# Patient Record
Sex: Female | Born: 2000 | Race: White | Hispanic: No | Marital: Single | Smoking: Never smoker
Health system: Southern US, Community
[De-identification: ages and names within clinical notes are randomized; demographics above are authoritative.]

## PROBLEM LIST (undated history)

## (undated) DIAGNOSIS — F32A Depression, unspecified: Secondary | ICD-10-CM

## (undated) DIAGNOSIS — F419 Anxiety disorder, unspecified: Secondary | ICD-10-CM

## (undated) DIAGNOSIS — T7840XA Allergy, unspecified, initial encounter: Secondary | ICD-10-CM

## (undated) HISTORY — PX: NO PAST SURGERIES: SHX2092

## (undated) HISTORY — DX: Allergy, unspecified, initial encounter: T78.40XA

## (undated) HISTORY — DX: Depression, unspecified: F32.A

## (undated) HISTORY — DX: Anxiety disorder, unspecified: F41.9

---
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
## 2019-02-08 ENCOUNTER — Other Ambulatory Visit: Payer: Self-pay

## 2019-02-08 ENCOUNTER — Ambulatory Visit (INDEPENDENT_AMBULATORY_CARE_PROVIDER_SITE_OTHER): Payer: 59 | Admitting: Certified Nurse Midwife

## 2019-02-08 VITALS — BP 119/74 | HR 86 | Ht 65.0 in | Wt 174.0 lb

## 2019-02-08 DIAGNOSIS — Z79899 Other long term (current) drug therapy: Secondary | ICD-10-CM

## 2019-02-08 DIAGNOSIS — Z3042 Encounter for surveillance of injectable contraceptive: Secondary | ICD-10-CM

## 2019-02-08 MED ORDER — MEDROXYPROGESTERONE ACETATE 150 MG/ML IM SUSP
150.0000 mg | Freq: Once | INTRAMUSCULAR | Status: AC
  Administered 2019-02-08: 150 mg via INTRAMUSCULAR

## 2019-02-08 NOTE — Progress Notes (Signed)
Date last pap: N/A. Last Depo-Provera: 11/19/2018. Side Effects if any: None per pt. Serum HCG indicated? N/A, patient is within dates. Depo-Provera 150 mg IM given by: Jennye Moccasin, CMA. Next appointment due 2/15-05/10/2019.  BP 119/74   Pulse 86   Ht 5\' 5"  (1.651 m)   Wt 174 lb (78.9 kg)   LMP 01/25/2019 (Approximate)   BMI 28.96 kg/m

## 2019-04-26 ENCOUNTER — Ambulatory Visit: Payer: 59

## 2019-04-27 ENCOUNTER — Telehealth: Payer: Self-pay

## 2019-04-27 ENCOUNTER — Other Ambulatory Visit: Payer: Self-pay

## 2019-04-27 ENCOUNTER — Telehealth: Payer: Self-pay | Admitting: Certified Nurse Midwife

## 2019-04-27 ENCOUNTER — Ambulatory Visit: Payer: BLUE CROSS/BLUE SHIELD

## 2019-04-27 MED ORDER — MEDROXYPROGESTERONE ACETATE 150 MG/ML IM SUSP: 150.0000 mg | INTRAMUSCULAR | 0 refills | Status: DC

## 2019-04-27 NOTE — Telephone Encounter (Signed)
Refill of Depo sent to preferred pharmacy per patient request.

## 2019-04-27 NOTE — Telephone Encounter (Signed)
Patient called following up with her depo refill. Could you please advise patient.  -TC

## 2019-04-27 NOTE — Telephone Encounter (Signed)
Patient is at pharmacy [facility]- they told her she doesn't have any refills for her depo shot. Could you please advise patient.  -TC

## 2019-04-27 NOTE — Telephone Encounter (Signed)
Refill sent 04/26/19-Tabitha Gus Height attempted to contact patient- message left.

## 2019-04-27 NOTE — Telephone Encounter (Signed)
Called patient to inform her that her prescription was refilled. She was unable to come to the phone.   -TC

## 2019-05-12 ENCOUNTER — Other Ambulatory Visit: Payer: Self-pay

## 2019-05-12 ENCOUNTER — Ambulatory Visit (INDEPENDENT_AMBULATORY_CARE_PROVIDER_SITE_OTHER): Payer: BC Managed Care – PPO | Admitting: Certified Nurse Midwife

## 2019-05-12 VITALS — BP 113/75 | HR 64 | Ht 65.0 in | Wt 173.5 lb

## 2019-05-12 DIAGNOSIS — Z3042 Encounter for surveillance of injectable contraceptive: Secondary | ICD-10-CM

## 2019-05-12 DIAGNOSIS — Z3202 Encounter for pregnancy test, result negative: Secondary | ICD-10-CM

## 2019-05-12 DIAGNOSIS — R300 Dysuria: Secondary | ICD-10-CM

## 2019-05-12 LAB — POCT URINALYSIS DIPSTICK
Bilirubin, UA: NEGATIVE
Blood, UA: NEGATIVE
Glucose, UA: NEGATIVE
Ketones, UA: NEGATIVE
Nitrite, UA: NEGATIVE
Protein, UA: NEGATIVE
Spec Grav, UA: 1.02 (ref 1.010–1.025)
Urobilinogen, UA: 0.2 E.U./dL
pH, UA: 6 (ref 5.0–8.0)

## 2019-05-12 LAB — POCT URINE PREGNANCY: Preg Test, Ur: NEGATIVE

## 2019-05-12 MED ORDER — MEDROXYPROGESTERONE ACETATE 150 MG/ML IM SUSP
150.0000 mg | Freq: Once | INTRAMUSCULAR | Status: AC
  Administered 2019-05-12: 11:00:00 150 mg via INTRAMUSCULAR

## 2019-05-12 NOTE — Progress Notes (Signed)
Date last pap: N/A. Last Depo-Provera: 11/19/2018. Side Effects if any: None per patient. Urine HCG indicated? Negative. Depo-Provera 150 mg IM given by: Park Meo, CMA. Next appointment due 5/19-08/11/2019.  BP 113/75   Pulse 64   Ht 5\' 5"  (1.651 m)   Wt 173 lb 8 oz (78.7 kg)   LMP  (LMP Unknown)   BMI 28.87 kg/m    Patient c/o occasional dysuria and vaginal itching x3 weeks, takes AZO with some relief, no vaginal discharge.  Vaginal self swab option given and patient declined.

## 2019-05-14 LAB — URINE CULTURE

## 2019-07-29 ENCOUNTER — Other Ambulatory Visit: Payer: Self-pay

## 2019-07-29 ENCOUNTER — Other Ambulatory Visit: Payer: Self-pay | Admitting: Certified Nurse Midwife

## 2019-07-29 ENCOUNTER — Ambulatory Visit (INDEPENDENT_AMBULATORY_CARE_PROVIDER_SITE_OTHER): Payer: BC Managed Care – PPO | Admitting: Certified Nurse Midwife

## 2019-07-29 DIAGNOSIS — Z3042 Encounter for surveillance of injectable contraceptive: Secondary | ICD-10-CM

## 2019-07-29 MED ORDER — MEDROXYPROGESTERONE ACETATE 150 MG/ML IM SUSP
150.0000 mg | Freq: Once | INTRAMUSCULAR | Status: AC
  Administered 2019-07-29: 150 mg via INTRAMUSCULAR

## 2019-07-29 NOTE — Progress Notes (Signed)
Date last pap: n/a. Last Depo-Provera: 05/12/2019 Side Effects if any: increase appetite Serum HCG indicated? n/a Depo-Provera 150 mg IM given by: Shawn Route, LPN. Next appointment due Aug 5-19,2021

## 2019-07-29 NOTE — Telephone Encounter (Signed)
Refill sent to preferred pharmacy. 

## 2019-10-11 ENCOUNTER — Emergency Department: Payer: BLUE CROSS/BLUE SHIELD

## 2019-10-11 ENCOUNTER — Emergency Department
Admission: EM | Admit: 2019-10-11 | Discharge: 2019-10-12 | Disposition: A | Discharge status: Home / Self Care | Payer: BLUE CROSS/BLUE SHIELD | Attending: Emergency Medicine | Admitting: Emergency Medicine

## 2019-10-11 ENCOUNTER — Other Ambulatory Visit: Payer: Self-pay

## 2019-10-11 DIAGNOSIS — R1030 Lower abdominal pain, unspecified: Secondary | ICD-10-CM | POA: Insufficient documentation

## 2019-10-11 DIAGNOSIS — R102 Pelvic and perineal pain: Secondary | ICD-10-CM | POA: Diagnosis not present

## 2019-10-11 LAB — URINALYSIS, COMPLETE (UACMP) WITH MICROSCOPIC
Bilirubin Urine: NEGATIVE
Glucose, UA: NEGATIVE mg/dL
Hgb urine dipstick: NEGATIVE
Ketones, ur: NEGATIVE mg/dL
Leukocytes,Ua: NEGATIVE
Nitrite: NEGATIVE
Protein, ur: NEGATIVE mg/dL
Specific Gravity, Urine: 1.009 (ref 1.005–1.030)
pH: 5 (ref 5.0–8.0)

## 2019-10-11 LAB — WET PREP, GENITAL
Clue Cells Wet Prep HPF POC: NONE SEEN
Sperm: NONE SEEN
Trich, Wet Prep: NONE SEEN
Yeast Wet Prep HPF POC: NONE SEEN

## 2019-10-11 LAB — COMPREHENSIVE METABOLIC PANEL
ALT: 14 U/L (ref 0–44)
AST: 17 U/L (ref 15–41)
Albumin: 4.2 g/dL (ref 3.5–5.0)
Alkaline Phosphatase: 116 U/L (ref 38–126)
Anion gap: 9 (ref 5–15)
BUN: 9 mg/dL (ref 6–20)
CO2: 23 mmol/L (ref 22–32)
Calcium: 9.3 mg/dL (ref 8.9–10.3)
Chloride: 108 mmol/L (ref 98–111)
Creatinine, Ser: 0.6 mg/dL (ref 0.44–1.00)
GFR calc Af Amer: >60 mL/min (ref >60)
GFR calc non Af Amer: >60 mL/min (ref >60)
Glucose, Bld: 95 mg/dL (ref 70–99)
Potassium: 3.7 mmol/L (ref 3.5–5.1)
Sodium: 140 mmol/L (ref 135–145)
Total Bilirubin: 0.4 mg/dL (ref 0.3–1.2)
Total Protein: 7.9 g/dL (ref 6.5–8.1)

## 2019-10-11 LAB — CBC
HCT: 37.4 % (ref 36.0–46.0)
Hemoglobin: 12.6 g/dL (ref 12.0–15.0)
MCH: 28.9 pg (ref 26.0–34.0)
MCHC: 33.7 g/dL (ref 30.0–36.0)
MCV: 85.8 fL (ref 80.0–100.0)
Platelets: 404 10*3/uL — ABNORMAL HIGH (ref 150–400)
RBC: 4.36 MIL/uL (ref 3.87–5.11)
RDW: 13.6 % (ref 11.5–15.5)
WBC: 7.4 10*3/uL (ref 4.0–10.5)
nRBC: 0 % (ref 0.0–0.2)

## 2019-10-11 LAB — LIPASE, BLOOD: Lipase: 25 U/L (ref 11–51)

## 2019-10-11 LAB — POCT PREGNANCY, URINE: Preg Test, Ur: NEGATIVE

## 2019-10-11 NOTE — ED Provider Notes (Signed)
-----------------------------------------   11:10 PM on 10/11/2019 -----------------------------------------  Assuming care from Dr. Lenard Lance.  In short, Rachel Garner is a 19 y.o. female with a chief complaint of pelvic pain.  Refer to the original H&P for additional details.  The current plan of care is to obtain U/S and reassess.   ----------------------------------------- 12:11 AM on 10/12/2019 -----------------------------------------  Patient's work-up has been very reassuring.  Vital signs are stable, no tachycardia, no evidence of acute infection including no STDs or abnormalities on wet prep.  Ultrasound also shows no sign of acute intrapelvic pathology.  Patient is resting quietly and apparently comfortably.  I updated her and her mother regarding all of the results and they are comfortable with the plan for discharge and outpatient follow-up.  Of note, she is nearing the time for another Depo shot and so they speculated that perhaps the pain is related to hormones and I think that is very possible.  I gave my usual and customary follow-up recommendations and return precautions.   Loleta Rose, MD 10/12/19 [phone]

## 2019-10-11 NOTE — ED Provider Notes (Signed)
[facility] Emergency Department Provider Note  Time seen: 10:52 PM  I have reviewed the triage vital signs and the nursing notes.   HISTORY  Chief Complaint Pelvic Pain and Vaginal Pain   HPI Rachel Garner is a 19 y.o. female no significant past medical history presents emergency department for lower abdominal discomfort.  According to the patient over the past month or so she has been experiencing intermittent lower abdominal discomfort and vaginal pain.   Past Medical History:  Diagnosis Date  . Allergy    seasonal (ragweed, "pigweed")    Patient Active Problem List   Diagnosis Date Noted  . Anxiety and depression 12/05/2016  . 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 NO PAST SURGERIES      Prior to Admission medications   Medication Sig Start Date End Date Taking? Authorizing Provider  albuterol (VENTOLIN HFA) 108 (90 Base) MCG/ACT inhaler Inhale 2 puffs into the lungs every 4 (four) hours as needed for wheezing or shortness of breath. 07/05/18   Alfred Levins, [place], MD  medroxyPROGESTERone Acetate 150 MG/ML SUSY INJECT 1 ML (150 MG TOTAL) INTO THE MUSCLE EVERY 3 MONTHS 07/29/19   Lawhorn, Lara Mulch, CNM    No Known Allergies  Family History  Problem Relation Age of Onset  . Alcohol abuse Mother   . Depression Mother   . Breast cancer Mother 70       HER1  . Alcohol abuse Father   . Heart disease Maternal Grandmother   . Skin cancer Maternal Grandmother   . Thyroid disease Maternal Grandmother   . Colon cancer Maternal Grandmother   . Multiple sclerosis Paternal Grandmother   . Heart disease Paternal Grandfather   . Stroke Neg Hx   . Ovarian cancer Neg Hx     Social History Social History   Tobacco Use  . Smoking status: Never Smoker  . Smokeless tobacco: Never Used  Vaping Use  . Vaping Use: Never used  Substance Use Topics  . Alcohol use: No  . Drug use:  Yes    Frequency: 4.0 times per week    Types: Marijuana    Review of Systems Constitutional: Negative for fever. Cardiovascular: Negative for chest pain. Respiratory: Negative for shortness of breath. Gastrointestinal: Negative for abdominal pain, vomiting Musculoskeletal: Negative for musculoskeletal complaints Neurological: Negative for headache All other ROS negative  ____________________________________________   PHYSICAL EXAM:  VITAL SIGNS: ED Triage Vitals  Enc Vitals Group     BP 10/11/19 1944 (!) 131/92     Pulse Rate 10/11/19 1944 88     Resp 10/11/19 1944 20     Temp 10/11/19 1944 99.5 F (37.5 C)     Temp Source 10/11/19 1944 Oral     SpO2 10/11/19 1944 100 %     Weight 10/11/19 1945 180 lb (81.6 kg)     Height 10/11/19 1945 '5\' 5"'  (1.651 m)     Head Circumference --      Peak Flow --      Pain Score 10/11/19 1944 7     Pain Loc --      Pain Edu? --      Excl. in [place]? --     Constitutional: Alert and oriented. Well appearing and in no distress. Eyes: Normal exam ENT      Head: Normocephalic and atraumatic.      Mouth/Throat: Mucous membranes are moist. Cardiovascular: Normal rate, regular rhythm.  No murmurs, rubs, or gallops. Respiratory: Normal respiratory effort without tachypnea nor retractions. Breath sounds are clear  Gastrointestinal: Slight suprapubic tenderness palpation.  No rebound guarding or distention. Musculoskeletal: Nontender with normal range of motion in all extremities. Neurologic:  Normal speech and language. No gross focal neurologic deficits Skin:  Skin is warm, dry and intact.  Psychiatric: Mood and affect are normal.  ____________________________________________     RADIOLOGY  Ultrasound pending, patient care signed out to oncoming physician.  ____________________________________________   INITIAL IMPRESSION / ASSESSMENT AND PLAN / ED COURSE  Pertinent labs & imaging results that were available during my care of the  patient were reviewed by me and considered in my medical decision making (see chart for details).   Patient presents emergency department for lower abdominal discomfort and cramping.  Pelvic exam shows no significant findings, wet prep negative.  GC/chlamydia negative.  Given complaint of cramping we will obtain a pelvic ultrasound to further evaluate.  Pelvic ultrasound pending.  Patient care signed out to oncoming physician.  Labs are largely within normal limits.  Rachel Garner was evaluated in Emergency Department on 10/11/2019 for the symptoms described in the history of present illness. She was evaluated in the context of the global COVID-19 pandemic, which necessitated consideration that the patient might be at risk for infection with the SARS-CoV-2 virus that causes COVID-19. Institutional protocols and algorithms that pertain to the evaluation of patients at risk for COVID-19 are in a state of rapid change based on information released by regulatory bodies including the CDC and federal and state organizations. These policies and algorithms were followed during the patient's care in the ED.  ____________________________________________   FINAL CLINICAL IMPRESSION(S) / ED DIAGNOSES  Abdominal cramping   Harvest Dark, MD 10/12/19 2359

## 2019-10-11 NOTE — ED Notes (Signed)
Pt at US at this time

## 2019-10-11 NOTE — ED Notes (Signed)
See triage note, pt reports groin pain that radiates to vaginal area and buttocks for the past week intermittently. Reports some vomiting within the past few days.  Denies pain with urination, fevers.  Reports some light spotting/vaginal bleeding, almost time for next depo shot, spotting is normal for her at this time

## 2019-10-11 NOTE — ED Triage Notes (Signed)
Pt states she is experiencing lower abdominal/pelvic pain and vaginal pain that radiates to the buttock. Pt states this pain has been progressively worsening for the last week. Denies burning with urination.

## 2019-10-11 NOTE — ED Notes (Signed)
Pt and pt mother state they are ready to go home when they can, denies any needs at this time

## 2019-10-12 LAB — CHLAMYDIA/NGC RT PCR (ARMC ONLY)
Chlamydia Tr: NOT DETECTED
N gonorrhoeae: NOT DETECTED

## 2019-10-12 NOTE — Discharge Instructions (Signed)
Your workup in the Emergency Department today was reassuring.  We did not find any specific abnormalities.  We recommend you drink plenty of fluids, take your regular medications and/or any new ones prescribed today, and follow up with the doctor(s) listed in these documents as recommended.  Return to the Emergency Department if you develop new or worsening symptoms that concern you.  

## 2019-10-29 ENCOUNTER — Ambulatory Visit (INDEPENDENT_AMBULATORY_CARE_PROVIDER_SITE_OTHER): Payer: BC Managed Care – PPO

## 2019-10-29 ENCOUNTER — Other Ambulatory Visit: Payer: Self-pay

## 2019-10-29 DIAGNOSIS — Z3042 Encounter for surveillance of injectable contraceptive: Secondary | ICD-10-CM | POA: Diagnosis not present

## 2019-10-29 MED ORDER — MEDROXYPROGESTERONE ACETATE 150 MG/ML IM SUSP
150.0000 mg | Freq: Once | INTRAMUSCULAR | Status: AC
  Administered 2019-10-29: 150 mg via INTRAMUSCULAR

## 2019-10-29 NOTE — Progress Notes (Signed)
Date last pap: underage Last Depo-Provera: 07/29/2019 . Side Effects if any: none Serum HCG indicated? N/a. Depo-Provera 150 mg IM given by: FHampton, LPN. Next appointment due November 5-19, 2021.

## 2019-11-03 ENCOUNTER — Ambulatory Visit (INDEPENDENT_AMBULATORY_CARE_PROVIDER_SITE_OTHER): Payer: BC Managed Care – PPO | Admitting: Certified Nurse Midwife

## 2019-11-03 ENCOUNTER — Encounter: Payer: Self-pay | Admitting: Certified Nurse Midwife

## 2019-11-03 ENCOUNTER — Other Ambulatory Visit (HOSPITAL_COMMUNITY)
Admission: RE | Admit: 2019-11-03 | Discharge: 2019-11-03 | Disposition: A | Discharge status: Home / Self Care | Payer: BC Managed Care – PPO | Source: Ambulatory Visit | Attending: Certified Nurse Midwife | Admitting: Certified Nurse Midwife

## 2019-11-03 ENCOUNTER — Other Ambulatory Visit: Payer: Self-pay

## 2019-11-03 VITALS — BP 106/77 | HR 65 | Ht 65.0 in | Wt 186.2 lb

## 2019-11-03 DIAGNOSIS — R102 Pelvic and perineal pain: Secondary | ICD-10-CM | POA: Diagnosis present

## 2019-11-03 NOTE — Progress Notes (Signed)
GYN ENCOUNTER NOTE  Subjective:       Rachel Garner is a 19 y.o. G0P0000 female is here for gynecologic evaluation of the following issues:  1. Pelvic pain , she went to the emergency department earlier this month and was referred here. She states she has had the pain x 2 months. She states it is constant and has progressed in intensity. It describes it as a stabbing pain causing her to double over at it worst. She denies any fever, and change in her cycle . She denies any changes in her bowel movements ( once daily) , she denies urgency or burning of urination. States she has frequency but that is not new for her. She denies any change in the pain with intercourse. She admits she is not able to have intercourse as frequently due to the pain. She denies any history of PCOS, ovarian cysts or endometriosis. She has a cousin that has enodometriosis. All of her testing done at the hospital was negative. She is on depo injections for birth control and states it has worked well for her.    Gynecologic History No LMP recorded. Patient has had an injection. Contraception: Depo-Provera injections Last Pap: n/a  Last mammogram: n/a  Obstetric History OB History  Gravida Para Term Preterm AB Living  0 0 0 0 0 0  SAB TAB Ectopic Multiple Live Births  0 0 0 0 0    Past Medical History:  Diagnosis Date  . Allergy    seasonal (ragweed, "pigweed")    Past Surgical History:  Procedure Laterality Date  . NO PAST SURGERIES      Current Outpatient Medications on File Prior to Visit  Medication Sig Dispense Refill  . albuterol (VENTOLIN HFA) 108 (90 Base) MCG/ACT inhaler Inhale 2 puffs into the lungs every 4 (four) hours as needed for wheezing or shortness of breath. 1 Inhaler 0  . medroxyPROGESTERone Acetate 150 MG/ML SUSY INJECT 1 ML (150 MG TOTAL) INTO THE MUSCLE EVERY 3 MONTHS 1 mL 3   No current facility-administered medications on file prior to visit.    No Known Allergies  Social  History   Socioeconomic History  . Marital status: Single    Spouse name: Not on file  . Number of children: Not on file  . Years of education: Not on file  . Highest education level: Not on file  Occupational History  . Not on file  Tobacco Use  . Smoking status: Never Smoker  . Smokeless tobacco: Never Used  Vaping Use  . Vaping Use: Never used  Substance and Sexual Activity  . Alcohol use: No  . Drug use: Yes    Frequency: 4.0 times per week    Types: Marijuana  . Sexual activity: Yes    Birth control/protection: Injection  Other Topics Concern  . Not on file  Social History Narrative  . Not on file   Social Determinants of Health   Financial Resource Strain:   . Difficulty of Paying Living Expenses: Not on file  Food Insecurity:   . Worried About Charity fundraiser in the Last Year: Not on file  . Ran Out of Food in the Last Year: Not on file  Transportation Needs:   . Lack of Transportation (Medical): Not on file  . Lack of Transportation (Non-Medical): Not on file  Physical Activity:   . Days of Exercise per Week: Not on file  . Minutes of Exercise per Session: Not on file  Stress:   . Feeling of Stress : Not on file  Social Connections:   . Frequency of Communication with Friends and Family: Not on file  . Frequency of Social Gatherings with Friends and Family: Not on file  . Attends Religious Services: Not on file  . Active Member of Clubs or Organizations: Not on file  . Attends Archivist Meetings: Not on file  . Marital Status: Not on file  Intimate Partner Violence:   . Fear of Current or Ex-Partner: Not on file  . Emotionally Abused: Not on file  . Physically Abused: Not on file  . Sexually Abused: Not on file    Family History  Problem Relation Age of Onset  . Alcohol abuse Mother   . Depression Mother   . Breast cancer Mother 24       HER1  . Alcohol abuse Father   . Heart disease Maternal Grandmother   . Skin cancer Maternal  Grandmother   . Thyroid disease Maternal Grandmother   . Colon cancer Maternal Grandmother   . Multiple sclerosis Paternal Grandmother   . Heart disease Paternal Grandfather   . Stroke Neg Hx   . Ovarian cancer Neg Hx     The following portions of the patient's history were reviewed and updated as appropriate: allergies, current medications, past family history, past medical history, past social history, past surgical history and problem list.  Review of Systems Review of Systems - Negative except as mentioned in HPI Review of Systems - General ROS: negative for - chills, fatigue, fever, hot flashes, malaise or night sweats Hematological and Lymphatic ROS: negative for - bleeding problems or swollen lymph nodes Gastrointestinal ROS: negative for - abdominal pain, blood in stools, change in bowel habits and nausea/vomiting Musculoskeletal ROS: negative for - joint pain, muscle pain or muscular weakness Genito-Urinary ROS: negative for - change in menstrual cycle, dysmenorrhea, dyspareunia, dysuria, genital discharge, genital ulcers, hematuria, incontinence, irregular/heavy menses, nocturia. Positive for Pelvic pain   Objective:   BP 106/77   Pulse 65   Ht '5\' 5"'  (1.651 m)   Wt 186 lb 3.2 oz (84.5 kg)   BMI 30.99 kg/m  CONSTITUTIONAL: Well-developed, well-nourished female in no acute distress.  HENT:  Normocephalic, atraumatic.  NECK: Normal range of motion, supple, no masses.  Normal thyroid.  SKIN: Skin is warm and dry. No rash noted. Not diaphoretic. No erythema. No pallor. [place]: Alert and oriented to person, place, and time. PSYCHIATRIC: Normal mood and affect. Normal behavior. Normal judgment and thought content. CARDIOVASCULAR:Not Examined RESPIRATORY: Not Examined BREASTS: Not Examined ABDOMEN: Soft, non distended; Non tender.  No Organomegaly. PELVIC:  External Genitalia: Normal  BUS: Normal  Vagina: Normal, clear to white discharge with mild odor noted.  Cervix:  Normal  Uterus: Normal size, shape,consistency, mobile  Adnexa: Normal  RV: Normal   Bladder: Nontender MUSCULOSKELETAL: Normal range of motion. No tenderness.  No cyanosis, clubbing, or edema.     Assessment:   1. Pelvic pain - Hepatitis B surface antigen - HSV 1 AND 2 IGM ABS, INDIRECT - HIV Antibody (routine testing w rflx) - RPR - Cervicovaginal ancillary only - US PELVIC COMPLETE WITH TRANSVAGINAL; Future - Urine Culture     Plan:   Discussed possible causes of pelvic pain including cysts, fibroids, endometriosis, infection. Reviewed pelvic anatomy ( bladder , uterus, bowel). Discussed r/o of infection for UTI, STD. Pt states she has taken tylenol, motrin, Midol , and Pamprin for pain. She gets some  relief with use of medications but she does not like to take medications. Recommend that she take motrin 600 mg every 6 hrs as needed for pain. Will follow up with results . Discussed possible referral to GI if test are unremarkable she verbalizes and agrees to plan of care. Will follow up with results.   Philip Aspen, CNM

## 2019-11-03 NOTE — Patient Instructions (Signed)
Pelvic Pain, Female Pelvic pain is pain in your lower belly (abdomen), below your belly button and between your hips. The pain may start suddenly (be acute), keep coming back (be recurring), or last a long time (become chronic). Pelvic pain that lasts longer than 6 months is called chronic pelvic pain. There are many causes of pelvic pain. Sometimes the cause of pelvic pain is not known. Follow these instructions at home:   Take over-the-counter and prescription medicines only as told by your doctor.  Rest as told by your doctor.  Do not have sex if it hurts.  Keep a journal of your pelvic pain. Write down: ? When the pain started. ? Where the pain is located. ? What seems to make the pain better or worse, such as food or your period (menstrual cycle). ? Any symptoms you have along with the pain.  Keep all follow-up visits as told by your doctor. This is important. Contact a doctor if:  Medicine does not help your pain.  Your pain comes back.  You have new symptoms.  You have unusual discharge or bleeding from your vagina.  You have a fever or chills.  You are having trouble pooping (constipation).  You have blood in your pee (urine) or poop (stool).  Your pee smells bad.  You feel weak or light-headed. Get help right away if:  You have sudden pain that is very bad.  Your pain keeps getting worse.  You have very bad pain and also have any of these symptoms: ? A fever. ? Feeling sick to your stomach (nausea). ? Throwing up (vomiting). ? Being very sweaty.  You pass out (lose consciousness). Summary  Pelvic pain is pain in your lower belly (abdomen), below your belly button and between your hips.  There are many possible causes of pelvic pain.  Keep a journal of your pelvic pain. This information is not intended to replace advice given to you by your health care provider. Make sure you discuss any questions you have with your health care provider. Document  Revised: 08/13/2017 Document Reviewed: 08/13/2017 Elsevier Patient Education  2020 Elsevier Inc.  

## 2019-11-04 ENCOUNTER — Other Ambulatory Visit: Payer: Self-pay | Admitting: Certified Nurse Midwife

## 2019-11-04 LAB — CERVICOVAGINAL ANCILLARY ONLY
Bacterial Vaginitis (gardnerella): POSITIVE — AB
Candida Glabrata: NEGATIVE
Candida Vaginitis: POSITIVE — AB
Chlamydia: NEGATIVE
Comment: NEGATIVE
Comment: NEGATIVE
Comment: NEGATIVE
Comment: NEGATIVE
Comment: NEGATIVE
Comment: NORMAL
Neisseria Gonorrhea: NEGATIVE
Trichomonas: NEGATIVE

## 2019-11-04 LAB — RPR: RPR Ser Ql: NONREACTIVE

## 2019-11-04 LAB — HSV(HERPES SIMPLEX VRS) I + II AB-IGG
HSV 1 Glycoprotein G Ab, IgG: <0.91 index (ref 0.00–0.90)
HSV 2 IgG, Type Spec: <0.91 index (ref 0.00–0.90)

## 2019-11-04 LAB — HEPATITIS B SURFACE ANTIGEN: Hepatitis B Surface Ag: NEGATIVE

## 2019-11-04 LAB — HIV ANTIBODY (ROUTINE TESTING W REFLEX): HIV Screen 4th Generation wRfx: NONREACTIVE

## 2019-11-04 MED ORDER — FLUCONAZOLE 150 MG PO TABS: 150.0000 mg | ORAL_TABLET | Freq: Once | ORAL | 1 refills | Status: AC

## 2019-11-04 MED ORDER — METRONIDAZOLE 500 MG PO TABS: 500.0000 mg | ORAL_TABLET | Freq: Two times a day (BID) | ORAL | 0 refills | Status: AC

## 2019-11-04 NOTE — Progress Notes (Signed)
Swab positive for BV and yeast. Orders placed for treatment.   Doreene Burke, CNM

## 2019-11-05 LAB — URINE CULTURE

## 2019-11-09 ENCOUNTER — Other Ambulatory Visit: Payer: Self-pay

## 2019-11-09 ENCOUNTER — Ambulatory Visit (INDEPENDENT_AMBULATORY_CARE_PROVIDER_SITE_OTHER): Payer: BC Managed Care – PPO

## 2019-11-09 DIAGNOSIS — R102 Pelvic and perineal pain: Secondary | ICD-10-CM | POA: Diagnosis not present

## 2020-01-20 ENCOUNTER — Telehealth: Payer: Self-pay

## 2020-01-20 NOTE — Telephone Encounter (Signed)
Pt called in and stated that she need their depo sent to [facility] in [place]. Please advise

## 2020-01-20 NOTE — Telephone Encounter (Signed)
mychart message sent

## 2020-01-21 ENCOUNTER — Ambulatory Visit: Payer: BC Managed Care – PPO

## 2020-01-25 ENCOUNTER — Ambulatory Visit (INDEPENDENT_AMBULATORY_CARE_PROVIDER_SITE_OTHER): Payer: BC Managed Care – PPO | Admitting: Certified Nurse Midwife

## 2020-01-25 ENCOUNTER — Other Ambulatory Visit: Payer: Self-pay

## 2020-01-25 DIAGNOSIS — Z3042 Encounter for surveillance of injectable contraceptive: Secondary | ICD-10-CM | POA: Diagnosis not present

## 2020-01-25 MED ORDER — MEDROXYPROGESTERONE ACETATE 150 MG/ML IM SUSP
150.0000 mg | Freq: Once | INTRAMUSCULAR | Status: AC
  Administered 2020-01-25: 150 mg via INTRAMUSCULAR

## 2020-01-25 NOTE — Progress Notes (Signed)
Date last pap: n/a Last Depo-Provera: 10/29/19 Side Effects if any: n/a Serum HCG indicated? n/a Depo-Provera 150 mg IM given by: Darrold Junker, CMA Next appointment due 04/11/20- 04/25/20

## 2020-02-02 IMAGING — DX PORTABLE CHEST - 1 VIEW
1 series · 1 of 1 positions shown · non-contrast
Comparison: 01/12/2008

CLINICAL DATA: Fever and cough.

EXAM:
PORTABLE CHEST 1 VIEW

[chest ap]
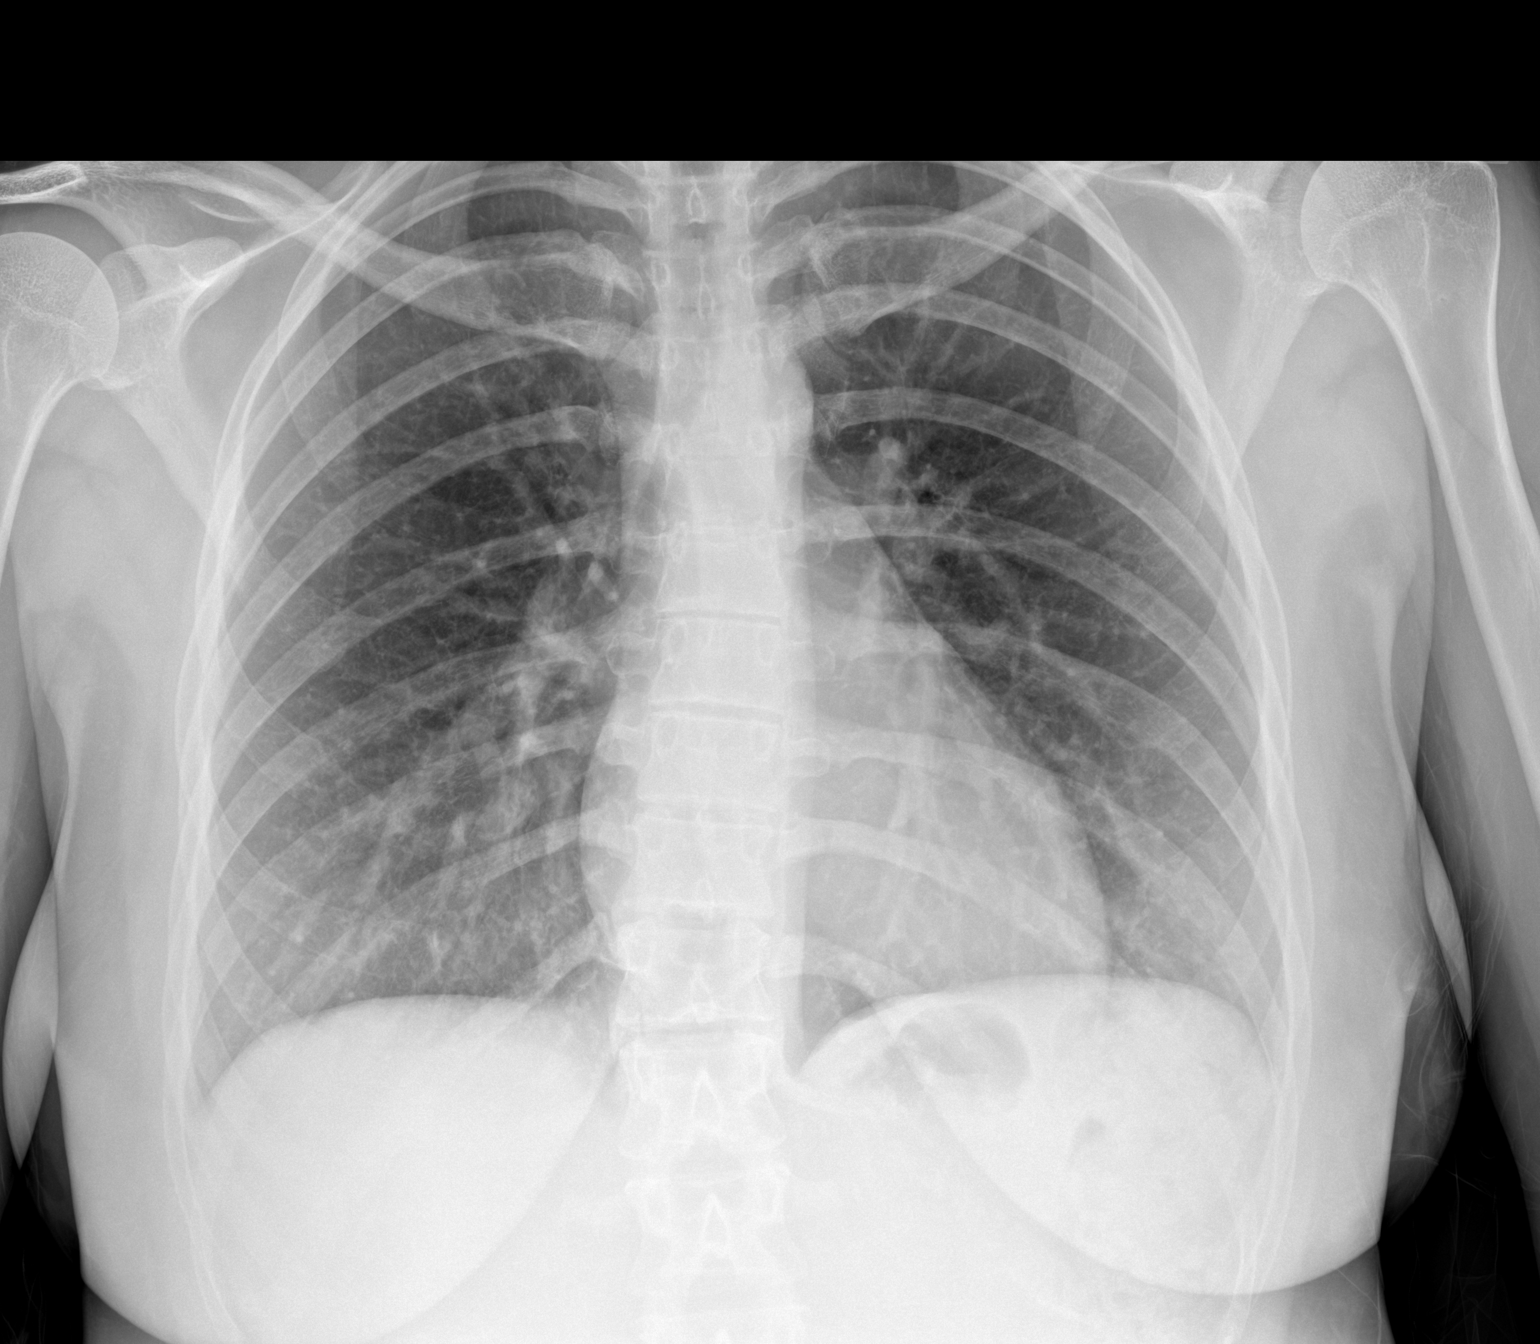

[1 of 1 positions shown; findings below may reference images not displayed]

FINDINGS: The heart size and mediastinal contours are within normal limits.
There is suggestion of bronchial thickening especially in the lower
lung zones bilaterally. Findings may be consistent with acute
bronchitis. There is no evidence of pulmonary edema, consolidation,
pneumothorax, nodule or pleural fluid. The bony thorax shows a mild
scoliosis of the thoracic spine convex left at the midthoracic level
with compensatory rightward convex thoracolumbar curvature.
IMPRESSION: 1. Bronchial thickening in the lower lung zones bilaterally which
may be consistent with acute bronchitis.
2. Mild thoracolumbar scoliosis.

## 2020-04-14 ENCOUNTER — Ambulatory Visit: Payer: BC Managed Care – PPO

## 2020-04-14 ENCOUNTER — Other Ambulatory Visit: Payer: Self-pay

## 2020-04-17 ENCOUNTER — Ambulatory Visit (INDEPENDENT_AMBULATORY_CARE_PROVIDER_SITE_OTHER): Payer: BC Managed Care – PPO

## 2020-04-17 ENCOUNTER — Other Ambulatory Visit: Payer: Self-pay

## 2020-04-17 DIAGNOSIS — Z3042 Encounter for surveillance of injectable contraceptive: Secondary | ICD-10-CM | POA: Diagnosis not present

## 2020-04-17 MED ORDER — MEDROXYPROGESTERONE ACETATE 150 MG/ML IM SUSP
150.0000 mg | Freq: Once | INTRAMUSCULAR | Status: AC
  Administered 2020-04-17: 150 mg via INTRAMUSCULAR

## 2020-04-17 NOTE — Progress Notes (Unsigned)
Last depo inj: 01/25/20 UPT:N/A Side effects:none Next Depo- Provera injection due: 07/03/20-07/17/20 Annual exam due:

## 2020-05-30 ENCOUNTER — Encounter: Payer: Self-pay | Admitting: Certified Nurse Midwife

## 2020-07-03 ENCOUNTER — Ambulatory Visit: Payer: BC Managed Care – PPO

## 2020-07-07 ENCOUNTER — Ambulatory Visit: Payer: BC Managed Care – PPO

## 2020-07-12 ENCOUNTER — Other Ambulatory Visit: Payer: Self-pay | Admitting: Certified Nurse Midwife

## 2020-07-13 ENCOUNTER — Other Ambulatory Visit: Payer: Self-pay | Admitting: Certified Nurse Midwife

## 2020-07-13 NOTE — Telephone Encounter (Signed)
Please contact patient. She will need ANNUAL or MEDICATION MANAGEMENT APPT. Thanks, JML

## 2020-07-13 NOTE — Telephone Encounter (Signed)
Patient has appointment to receive her Depo injection tomorrow.  Patient states she needs a current prescription.  [facility] would [facility] [place].

## 2020-07-14 ENCOUNTER — Ambulatory Visit: Payer: BC Managed Care – PPO

## 2020-07-20 ENCOUNTER — Other Ambulatory Visit: Payer: Self-pay

## 2020-07-20 ENCOUNTER — Encounter: Payer: Self-pay | Admitting: Certified Nurse Midwife

## 2020-07-20 ENCOUNTER — Ambulatory Visit (INDEPENDENT_AMBULATORY_CARE_PROVIDER_SITE_OTHER): Payer: BC Managed Care – PPO | Admitting: Certified Nurse Midwife

## 2020-07-20 VITALS — BP 128/78 | HR 80 | Resp 16 | Ht 65.0 in | Wt 186.4 lb

## 2020-07-20 DIAGNOSIS — Z803 Family history of malignant neoplasm of breast: Secondary | ICD-10-CM | POA: Diagnosis not present

## 2020-07-20 DIAGNOSIS — Z3042 Encounter for surveillance of injectable contraceptive: Secondary | ICD-10-CM | POA: Diagnosis not present

## 2020-07-20 DIAGNOSIS — Z01419 Encounter for gynecological examination (general) (routine) without abnormal findings: Secondary | ICD-10-CM

## 2020-07-20 MED ORDER — MEDROXYPROGESTERONE ACETATE 150 MG/ML IM SUSP
150.0000 mg | Freq: Once | INTRAMUSCULAR | Status: AC
  Administered 2020-07-20: 150 mg via INTRAMUSCULAR

## 2020-07-20 MED ORDER — MEDROXYPROGESTERONE ACETATE 150 MG/ML IM SUSY: PREFILLED_SYRINGE | INTRAMUSCULAR | 3 refills | Status: DC

## 2020-07-20 NOTE — Progress Notes (Signed)
ANNUAL PREVENTATIVE CARE GYN  ENCOUNTER NOTE  Subjective:       Rachel Garner is a 20 y.o. G0P0000 female here for a routine annual gynecologic exam.  Current complaints: 1. Needs Depo injection and refill 2. Positive depression screening  Denies difficulty breathing or respiratory distress, chest pain, abdominal pain, excessive vaginal bleeding, dysuria, and leg pain or swelling.    Gynecologic History  No LMP recorded. Patient has had an injection.  Contraception: Depo-Provera injections  Last Pap: N/A.  Obstetric History  OB History  Gravida Para Term Preterm AB Living  0 0 0 0 0 0  SAB IAB Ectopic Multiple Live Births  0 0 0 0 0    Past Medical History:  Diagnosis Date  . Allergy    seasonal (ragweed, "pigweed")    Past Surgical History:  Procedure Laterality Date  . NO PAST SURGERIES      Current Outpatient Medications on File Prior to Visit  Medication Sig Dispense Refill  . albuterol (VENTOLIN HFA) 108 (90 Base) MCG/ACT inhaler Inhale 2 puffs into the lungs every 4 (four) hours as needed for wheezing or shortness of breath. 1 Inhaler 0  . medroxyPROGESTERone Acetate 150 MG/ML SUSY INJECT 1 ML (150 MG TOTAL) INTO THE MUSCLE EVERY 3 MONTHS 1 mL 3   No current facility-administered medications on file prior to visit.    No Known Allergies  Social History   Socioeconomic History  . Marital status: Single    Spouse name: Not on file  . Number of children: Not on file  . Years of education: Not on file  . Highest education level: Not on file  Occupational History  . Not on file  Tobacco Use  . Smoking status: Never Smoker  . Smokeless tobacco: Never Used  Vaping Use  . Vaping Use: Never used  Substance and Sexual Activity  . Alcohol use: No  . Drug use: Yes    Frequency: 4.0 times per week    Types: Marijuana  . Sexual activity: Yes    Birth control/protection: Injection  Other Topics Concern  . Not on file  Social History Narrative  .  Not on file   Social Determinants of Health   Financial Resource Strain: Not on file  Food Insecurity: Not on file  Transportation Needs: Not on file  Physical Activity: Not on file  Stress: Not on file  Social Connections: Not on file  Intimate Partner Violence: Not on file    Family History  Problem Relation Age of Onset  . Alcohol abuse Mother   . Depression Mother   . Breast cancer Mother 59       HER1  . Alcohol abuse Father   . Heart disease Maternal Grandmother   . Skin cancer Maternal Grandmother   . Thyroid disease Maternal Grandmother   . Colon cancer Maternal Grandmother   . Multiple sclerosis Paternal Grandmother   . Heart disease Paternal Grandfather   . Stroke Neg Hx   . Ovarian cancer Neg Hx     The following portions of the patient's history were reviewed and updated as appropriate: allergies, current medications, past family history, past medical history, past social history, past surgical history and problem list.  Review of Systems  ROS negative except as noted above. Information obtained from patient.    Objective:   BP 128/78   Pulse 80   Resp 16   Ht '5\' 5"'  (1.651 m)   Wt 186 lb 6.4 oz (84.6  kg)   BMI 31.02 kg/m    CONSTITUTIONAL: Well-developed, well-nourished female in no acute distress.   [place]: Alert and oriented to person, place, and time. Normal muscle tone coordination. No cranial nerve deficit noted.  HENT:  Normocephalic, atraumatic.  EYES: Conjunctivae and EOM are normal.   SKIN: Skin is warm and dry. No rash noted. Not diaphoretic. No erythema. No pallor. Professional tattoos present.   CARDIOVASCULAR: Normal heart rate noted, regular rhythm, no murmur.  RESPIRATORY: Clear to auscultation bilaterally. Effort and breath sounds normal, no problems with respiration noted.  BREASTS: Declined.   ABDOMEN: Soft, normal bowel sounds, no distention noted.  No tenderness, rebound or guarding.   PELVIC:  Declined   MUSCULOSKELETAL: Normal range of motion. No tenderness.  No cyanosis, clubbing, or edema.  2+ distal pulses.   Depression screen [facility] 2/9 07/20/2020 12/05/2016  Decreased Interest 3 1  Down, Depressed, Hopeless 3 2  PHQ - 2 Score 6 3  Altered sleeping 1 3  Tired, decreased energy 3 3  Change in appetite 1 1  Feeling bad or failure about yourself  2 3  Trouble concentrating 1 1  Moving slowly or fidgety/restless 1 0  Suicidal thoughts 1 1  PHQ-9 Score 16 15  Difficult doing work/chores Somewhat difficult Somewhat difficult   GAD 7 : Generalized Anxiety Score 07/20/2020  Nervous, Anxious, on Edge 3  Control/stop worrying 1  Worry too much - different things 1  Trouble relaxing 3  Restless 1  Easily annoyed or irritable 3  Afraid - awful might happen 1  Total GAD 7 Score 13  Anxiety Difficulty Somewhat difficult      Assessment:   Annual gynecologic examination 20 y.o.   Contraception: Depo-Provera injections   Obesity 1   Problem List Items Addressed This Visit   None   Visit Diagnoses    Well woman exam    -  Primary   Encounter for surveillance of injectable contraceptive       Depot contraception          Plan:   Pap: Not needed  Labs: Declined  Rx Depo, see orders; injection given today, see chart  Routine preventative health maintenance measures emphasized: Exercise/Diet/Weight control, Tobacco Warnings, Alcohol/Substance use risks, Stress Management, Peer Pressure Issues and Safe Sex; see AVS  Encouraged online/textable counselor since patient works three (3) jobs  Reviewed red flag symptoms and when to call  Return to Clinic - 1 Year for [employer] or sooner if needed   Dani Gobble, CNM  [facility], [facility] 07/20/20 9:10 AM

## 2020-07-20 NOTE — Progress Notes (Signed)
Date last pap: N/A Last Depo-Provera: 04/17/2020 Side Effects if any: N/A Serum HCG indicated? Negative Depo-Provera 150 mg IM given by: Santiago Bumpers, CMA Next appointment due: July 28- Aug. 11

## 2020-07-20 NOTE — Patient Instructions (Addendum)
http://APA.org/depression-guideline"> https://clinicalkey.com"> http://point-of-care.elsevierperformancemanager.com/skills/"> http://point-of-care.elsevierperformancemanager.[email or web address]">  Managing Depression, Adult Depression is a mental health condition that affects your thoughts, feelings, and actions. Being diagnosed with depression can bring you relief if you did not know why you have felt or behaved a certain way. It could also leave you feeling overwhelmed with uncertainty about your future. Preparing yourself to manage your symptoms can help you feel more positive about your future. How to manage lifestyle changes Managing stress Stress is your body's reaction to life changes and events, both good and bad. Stress can add to your feelings of depression. Learning to manage your stress can help lessen your feelings of depression. Try some of the following approaches to reducing your stress (stress reduction techniques):  Listen to music that you enjoy and that inspires you.  Try using a meditation app or take a meditation class.  Develop a practice that helps you connect with your spiritual self. Walk in nature, pray, or go to a place of worship.  Do some deep breathing. To do this, inhale slowly through your nose. Pause at the top of your inhale for a few seconds and then exhale slowly, letting your muscles relax.  Practice yoga to help relax and work your muscles. Choose a stress reduction technique that suits your lifestyle and personality. These techniques take time and practice to develop. Set aside 5-15 minutes a day to do them. Therapists can offer training in these techniques. Other things you can do to manage stress include:  Keeping a stress diary.  Knowing your limits and saying no when you think something is too much.  Paying attention to how you react to certain situations. You may not be able to control everything, but you can change your reaction.  Adding humor to your life by  watching funny films or TV shows.  Making time for activities that you enjoy and that relax you.   Medicines Medicines, such as antidepressants, are often a part of treatment for depression.  Talk with your pharmacist or health care provider about all the medicines, supplements, and herbal products that you take, their possible side effects, and what medicines and other products are safe to take together.  Make sure to report any side effects you may have to your health care provider. Relationships Your health care provider may suggest family therapy, couples therapy, or individual therapy as part of your treatment. How to recognize changes Everyone responds differently to treatment for depression. As you recover from depression, you may start to:  Have more interest in doing activities.  Feel less hopeless.  Have more energy.  Overeat less often, or have a better appetite.  Have better mental focus. It is important to recognize if your depression is not getting better or is getting worse. The symptoms you had in the beginning may return, such as:  Tiredness (fatigue) or low energy.  Eating too much or too little.  Sleeping too much or too little.  Feeling restless, agitated, or hopeless.  Trouble focusing or making decisions.  Unexplained physical complaints.  Feeling irritable, angry, or aggressive. If you or your family members notice these symptoms coming back, let your health care provider know right away. Follow these instructions at home: Activity  Try to get some form of exercise each day, such as walking, biking, swimming, or lifting weights.  Practice stress reduction techniques.  Engage your mind by taking a class or doing some volunteer work.   Lifestyle  Get the right amount and quality of sleep.    Cut down on using caffeine, tobacco, alcohol, and other potentially harmful substances.  Eat a healthy diet that includes plenty of vegetables, fruits,  whole grains, low-fat dairy products, and lean protein. Do not eat a lot of foods that are high in solid fats, added sugars, or salt (sodium). General instructions  Take over-the-counter and prescription medicines only as told by your health care provider.  Keep all follow-up visits as told by your health care provider. This is important. Where to find support Talking to others Friends and family members can be sources of support and guidance. Talk to trusted friends or family members about your condition. Explain your symptoms to them, and let them know that you are working with a health care provider to treat your depression. Tell friends and family members how they also can be helpful.   Finances  Find appropriate mental health providers that fit with your financial situation.  Talk with your health care provider about options to get reduced prices on your medicines. Where to find more information You can find support in your area from:  Anxiety and Depression Association of America (ADAA): www.adaa.org  Mental Health America: www.mentalhealthamerica.net  [employer] on Mental Illness: www.nami.org Contact a health care provider if:  You stop taking your antidepressant medicines, and you have any of these symptoms: ? Nausea. ? Headache. ? Light-headedness. ? Chills and body aches. ? Not being able to sleep (insomnia).  You or your friends and family think your depression is getting worse. Get help right away if:  You have thoughts of hurting yourself or others. If you ever feel like you may hurt yourself or others, or have thoughts about taking your own life, get help right away. Go to your nearest emergency department or:  Call your local emergency services (911 in the [place]  Call a suicide crisis helpline, such as the [place] at 401-317-7625. This is open 24 hours a day in the [place]  Text the Crisis Text Line at 831-163-6972 (in the  [place].). Summary  If you are diagnosed with depression, preparing yourself to manage your symptoms is a good way to feel positive about your future.  Work with your health care provider on a management plan that includes stress reduction techniques, medicines (if applicable), therapy, and healthy lifestyle habits.  Keep talking with your health care provider about how your treatment is working.  If you have thoughts about taking your own life, call a suicide crisis helpline or text a crisis text line. This information is not intended to replace advice given to you by your health care provider. Make sure you discuss any questions you have with your health care provider. Document Revised: 01/06/2019 Document Reviewed: 01/06/2019 Elsevier Patient Education  [address].   http://NIMH.NIH.Gov">  Generalized Anxiety Disorder, Adult Generalized anxiety disorder (GAD) is a mental health condition. Unlike normal worries, anxiety related to GAD is not triggered by a specific event. These worries do not fade or get better with time. GAD interferes with relationships, work, and school. GAD symptoms can vary from mild to severe. People with severe GAD can have intense waves of anxiety with physical symptoms that are similar to panic attacks. What are the causes? The exact cause of GAD is not known, but the following are believed to have an impact:  Differences in natural brain chemicals.  Genes passed down from parents to children.  Differences in the way threats are perceived.  Development during childhood.  Personality. What increases  the risk? The following factors may make you more likely to develop this condition:  Being female.  Having a family history of anxiety disorders.  Being very shy.  Experiencing very stressful life events, such as the death of a loved one.  Having a very stressful family environment. What are the signs or symptoms? People with GAD often worry  excessively about many things in their lives, such as their health and family. Symptoms may also include:  Mental and emotional symptoms: ? Worrying excessively about natural disasters. ? Fear of being late. ? Difficulty concentrating. ? Fears that others are judging your performance.  Physical symptoms: ? Fatigue. ? Headaches, muscle tension, muscle twitches, trembling, or feeling shaky. ? Feeling like your heart is pounding or beating very fast. ? Feeling out of breath or like you cannot take a deep breath. ? Having trouble falling asleep or staying asleep, or experiencing restlessness. ? Sweating. ? Nausea, diarrhea, or irritable bowel syndrome (IBS).  Behavioral symptoms: ? Experiencing erratic moods or irritability. ? Avoidance of new situations. ? Avoidance of people. ? Extreme difficulty making decisions. How is this diagnosed? This condition is diagnosed based on your symptoms and medical history. You will also have a physical exam. Your health care provider may perform tests to rule out other possible causes of your symptoms. To be diagnosed with GAD, a person must have anxiety that:  Is out of his or her control.  Affects several different aspects of his or her life, such as work and relationships.  Causes distress that makes him or her unable to take part in normal activities.  Includes at least three symptoms of GAD, such as restlessness, fatigue, trouble concentrating, irritability, muscle tension, or sleep problems. Before your health care provider can confirm a diagnosis of GAD, these symptoms must be present more days than they are not, and they must last for 6 months or longer. How is this treated? This condition may be treated with:  Medicine. Antidepressant medicine is usually prescribed for long-term daily control. Anti-anxiety medicines may be added in severe cases, especially when panic attacks occur.  Talk therapy (psychotherapy). Certain types of talk  therapy can be helpful in treating GAD by providing support, education, and guidance. Options include: ? Cognitive behavioral therapy (CBT). People learn coping skills and self-calming techniques to ease their physical symptoms. They learn to identify unrealistic thoughts and behaviors and to replace them with more appropriate thoughts and behaviors. ? Acceptance and commitment therapy (ACT). This treatment teaches people how to be mindful as a way to cope with unwanted thoughts and feelings. ? Biofeedback. This process trains you to manage your body's response (physiological response) through breathing techniques and relaxation methods. You will work with a therapist while machines are used to monitor your physical symptoms.  Stress management techniques. These include yoga, meditation, and exercise. A mental health specialist can help determine which treatment is best for you. Some people see improvement with one type of therapy. However, other people require a combination of therapies.   Follow these instructions at home: Lifestyle  Maintain a consistent routine and schedule.  Anticipate stressful situations. Create a plan, and allow extra time to work with your plan.  Practice stress management or self-calming techniques that you have learned from your therapist or your health care provider. General instructions  Take over-the-counter and prescription medicines only as told by your health care provider.  Understand that you are likely to have setbacks. Accept this and be kind to yourself  as you persist to take better care of yourself.  Recognize and accept your accomplishments, even if you judge them as small.  Keep all follow-up visits as told by your health care provider. This is important. Contact a health care provider if:  Your symptoms do not get better.  Your symptoms get worse.  You have signs of depression, such as: ? A persistently sad or irritable mood. ? Loss of  enjoyment in activities that used to bring you joy. ? Change in weight or eating. ? Changes in sleeping habits. ? Avoiding friends or family members. ? Loss of energy for normal tasks. ? Feelings of guilt or worthlessness. Get help right away if:  You have serious thoughts about hurting yourself or others. If you ever feel like you may hurt yourself or others, or have thoughts about taking your own life, get help right away. Go to your nearest emergency department or:  Call your local emergency services (911 in the [place]  Call a suicide crisis helpline, such as the [place] at (912) 020-9076. This is open 24 hours a day in the [place]  Text the Crisis Text Line at 959 135 7754 (in the [place].). Summary  Generalized anxiety disorder (GAD) is a mental health condition that involves worry that is not triggered by a specific event.  People with GAD often worry excessively about many things in their lives, such as their health and family.  GAD may cause symptoms such as restlessness, trouble concentrating, sleep problems, frequent sweating, nausea, diarrhea, headaches, and trembling or muscle twitching.  A mental health specialist can help determine which treatment is best for you. Some people see improvement with one type of therapy. However, other people require a combination of therapies. This information is not intended to replace advice given to you by your health care provider. Make sure you discuss any questions you have with your health care provider. Document Revised: 12/16/2018 Document Reviewed: 12/16/2018 Elsevier Patient Education  2021 [place] Breast self-awareness is knowing how your breasts look and feel. Doing breast self-awareness is important. It allows you to catch a breast problem early while it is still small and can be treated. All women should do breast self-awareness, including women who have had breast implants. Tell  your doctor if you notice a change in your breasts. What you need:  A mirror.  A well-lit room. How to do a breast self-exam A breast self-exam is one way to learn what is normal for your breasts and to check for changes. To do a breast self-exam: Look for changes 1. Take off all the clothes above your waist. 2. Stand in front of a mirror in a room with good lighting. 3. Put your hands on your hips. 4. Push your hands down. 5. Look at your breasts and nipples in the mirror to see if one breast or nipple looks different from the other. Check to see if: ? The shape of one breast is different. ? The size of one breast is different. ? There are wrinkles, dips, and bumps in one breast and not the other. 6. Look at each breast for changes in the skin, such as: ? Redness. ? Scaly areas. 7. Look for changes in your nipples, such as: ? Liquid around the nipples. ? Bleeding. ? Dimpling. ? Redness. ? A change in where the nipples are.   Feel for changes 1. Lie on your back on the floor. 2. Feel each breast. To do this,  follow these steps: ? Pick a breast to feel. ? Put the arm closest to that breast above your head. ? Use your other arm to feel the nipple area of your breast. Feel the area with the pads of your three middle fingers by making small circles with your fingers. For the first circle, press lightly. For the second circle, press harder. For the third circle, press even harder. ? Keep making circles with your fingers at the different pressures as you move down your breast. Stop when you feel your ribs. ? Move your fingers a little toward the center of your body. ? Start making circles with your fingers again, this time going up until you reach your collarbone. ? Keep making up-and-down circles until you reach your armpit. Remember to keep using the three pressures. ? Feel the other breast in the same way. 3. Sit or stand in the tub or shower. 4. With soapy water on your skin, feel  each breast the same way you did in step 2 when you were lying on the floor.   Write down what you find Writing down what you find can help you remember what to tell your doctor. Write down:  What is normal for each breast.  Any changes you find in each breast, including: ? The kind of changes you find. ? Whether you have pain. ? Size and location of any lumps.  When you last had your menstrual period. General tips  Check your breasts every month.  If you are breastfeeding, the best time to check your breasts is after you feed your baby or after you use a breast pump.  If you get menstrual periods, the best time to check your breasts is 5-7 days after your menstrual period is over.  With time, you will become comfortable with the self-exam, and you will begin to know if there are changes in your breasts. Contact a doctor if you:  See a change in the shape or size of your breasts or nipples.  See a change in the skin of your breast or nipples, such as red or scaly skin.  Have fluid coming from your nipples that is not normal.  Find a lump or thick area that was not there before.  Have pain in your breasts.  Have any concerns about your breast health. Summary  Breast self-awareness includes looking for changes in your breasts, as well as feeling for changes within your breasts.  Breast self-awareness should be done in front of a mirror in a well-lit room.  You should check your breasts every month. If you get menstrual periods, the best time to check your breasts is 5-7 days after your menstrual period is over.  Let your doctor know of any changes you see in your breasts, including changes in size, changes on the skin, pain or tenderness, or fluid from your nipples that is not normal. This information is not intended to replace advice given to you by your health care provider. Make sure you discuss any questions you have with your health care provider. Document Revised:  10/14/2017 Document Reviewed: 10/14/2017 Elsevier Patient Education  2021 [place].   Contraceptive Injection A contraceptive injection is a shot that prevents pregnancy. It is also called a birth control shot. The shot contains the hormone progestin, which prevents pregnancy by:  Stopping the ovaries from releasing eggs.  Thickening cervical mucus to prevent sperm from entering the cervix.  Thinning the lining of the uterus to prevent a  fertilized egg from attaching to the uterus. Contraceptive injections are given under the skin (subcutaneous) or into a muscle (intramuscular). For these shots to work, you must get one of them every 3 months (12-13 weeks) from a health care provider. Tell a health care provider about:  Any allergies you have.  All medicines you are taking, including vitamins, herbs, eye drops, creams, and over-the-counter medicines.  Any blood disorders you have.  Any medical conditions you have.  Whether you are pregnant or may be pregnant. What are the risks? Generally, this is a safe procedure. However, problems may occur, including:  Mood changes or depression.  Loss of bone density (osteoporosis) after long-term use.  Blood clots. This is rare.  Higher risk of an egg being fertilized outside your uterus (ectopic pregnancy).This is rare. What happens before the procedure?  Your health care provider may do a routine physical exam.  You may have a test to make sure you are not pregnant. What happens during the procedure?  The area where the shot will be given will be cleaned and sanitized with alcohol.  A needle will be inserted into a muscle in your upper arm or buttock, or into the skin of your thigh or abdomen. The needle will be attached to a syringe with the medicine inside of it.  The medicine will be pushed through the syringe and injected into your body.  A small bandage (dressing) may be placed over the injection site.   What can I  expect after the procedure?  After the procedure, it is common to have: ? Soreness around the injection site for a couple of days. ? Irregular menstrual bleeding. ? Weight gain. ? Breast tenderness. ? Headaches. ? Discomfort in your abdomen.  Ask your health care provider whether you need to use an added method of birth control (backup contraception), such as a condom, sponge, or spermicide. ? If the first shot is given 1-7 days after the start of your last menstrual period, you will not need backup contraception. ? If the first shot is given at any other time during your menstrual cycle, you should avoid having sex. If you do have sex, you will need to use backup contraception for 7 days after you receive the shot. Follow these instructions at home: General instructions  Take over-the-counter and prescription medicines only as told by your health care provider.  Do not rub or massage the injection site.  Track your menstrual periods so you will know if they become irregular.  Always use a condom to protect against sexually transmitted infections (STIs).  Make sure you schedule an appointment in time for your next shot and mark it on your calendar. You must get an injection every 3 months (12-13 weeks) to prevent pregnancy. Lifestyle  Do not use any products that contain nicotine or tobacco. These products include cigarettes, chewing tobacco, and vaping devices, such as e-cigarettes. If you need help quitting, ask your health care provider.  Eat foods that are high in calcium and vitamin D, such as milk, cheese, and salmon. Doing this may help with any loss in bone density caused by the contraceptive injection. Ask your health care provider for dietary recommendations. Contact a health care provider if you:  Have nausea or vomiting.  Have abnormal vaginal discharge or bleeding.  Miss a menstrual period or think you might be pregnant.  Experience mood changes or  depression.  Feel dizzy or light-headed.  Have leg pain. Get help right away if  you:  Have chest pain or cough up blood.  Have shortness of breath.  Have a severe headache that does not go away.  Have numbness in any part of your body.  Have slurred speech or vision problems.  Have vaginal bleeding that is abnormally heavy or does not stop, or you have severe pain in your abdomen.  Have depression that does not get better with treatment. If you ever feel like you may hurt yourself or others, or have thoughts about taking your own life, get help right away. Go to your nearest emergency department or:  Call your local emergency services (911 in the [place]  Call a suicide crisis helpline, such as the [place] at (571)852-8870. This is open 24 hours a day in the [place]  Text the Crisis Text Line at (718) 021-1700 (in the [place].). Summary  A contraceptive injection is a shot that prevents pregnancy. It is also called the birth control shot.  The shot is given under the skin (subcutaneous) or into a muscle (intramuscular).  After this procedure, it is common to have soreness around the injection site for a couple of days.  To prevent pregnancy, the shot must be given by a health care provider every 3 months (12-13 weeks).  After you have the shot, ask your health care provider whether you need to use an added method of birth control (backup contraception), such as a condom, sponge, or spermicide. This information is not intended to replace advice given to you by your health care provider. Make sure you discuss any questions you have with your health care provider. Document Revised: 09/06/2019 Document Reviewed: 09/06/2019 Elsevier Patient Education  [place] 65-32 Years Old, Female Preventive care refers to lifestyle choices and visits with your health care provider that can promote health and wellness. At this stage in your life,  you may start seeing a primary care physician instead of a pediatrician. It is important to take responsibility for your health and well-being. Preventive care for young adults includes:  A yearly physical exam. This is also called an annual wellness visit.  Regular dental and eye exams.  Immunizations.  Screening for certain conditions.  Healthy lifestyle choices, such as: ? Eating a healthy diet. ? Getting regular exercise. ? Not using drugs or products that contain nicotine and tobacco. ? Limiting alcohol use. What can I expect for my preventive care visit? Physical exam Your health care provider may check your:  Height and weight. These may be used to calculate your BMI (body mass index). BMI is a measurement that tells if you are at a healthy weight.  Heart rate and blood pressure.  Body temperature.  Skin for abnormal spots. Counseling Your health care provider may ask you questions about your:  Past medical problems.  Family's medical history.  Alcohol, tobacco, and drug use.  Home life and relationship well-being.  Access to firearms.  Emotional well-being.  Diet, exercise, and sleep habits.  Sexual activity and sexual health.  Method of birth control.  Menstrual cycle.  Pregnancy history. What immunizations do I need? Vaccines are usually given at various ages, according to a schedule. Your health care provider will recommend vaccines for you based on your age, medical history, and lifestyle or other factors, such as travel or where you work.   What tests do I need? Blood tests  Lipid and cholesterol levels. These may be checked every 5 years starting at age 72.  Hepatitis  C test.  Hepatitis B test. Screening  Pelvic exam and Pap test. This may be done every 3 years starting at age 95.  STD (sexually transmitted disease) testing, if you are at risk.  BRCA-related cancer screening. This may be done if you have a family history of breast,  ovarian, tubal, or peritoneal cancers. Other tests  Tuberculosis skin test.  Vision and hearing tests.  Skin exam.  Breast exam. Talk with your health care provider about your test results, treatment options, and if necessary, the need for more tests. Follow these instructions at home: Eating and drinking  Eat a healthy diet that includes fresh fruits and vegetables, whole grains, lean protein, and low-fat dairy products.  Drink enough fluid to keep your urine pale yellow.  Do not drink alcohol if: ? Your health care provider tells you not to drink. ? You are pregnant, may be pregnant, or are planning to become pregnant. ? You are under the legal drinking age. In the [place] the legal drinking age is 45.  If you drink alcohol: ? Limit how much you use to 0-1 drink a day. ? Be aware of how much alcohol is in your drink. In the [place] one drink equals one 12 oz bottle of beer (355 mL), one 5 oz glass of wine (148 mL), or one 1 oz glass of hard liquor (44 mL).   Lifestyle  Take daily care of your teeth and gums. Brush your teeth every morning and night with fluoride toothpaste. Floss one time each day.  Stay active. Exercise for at least 30 minutes 5 or more days of the week.  Do not use any products that contain nicotine or tobacco, such as cigarettes, e-cigarettes, and chewing tobacco. If you need help quitting, ask your health care provider.  Do not use drugs.  If you are sexually active, practice safe sex. Use a condom or other form of protection to prevent STIs (sexually transmitted infections).  If you do not wish to become pregnant, use a form of birth control. If you plan to become pregnant, see your health care provider for a prepregnancy visit.  Find healthy ways to cope with stress, such as: ? Meditation, yoga, or listening to music. ? Journaling. ? Talking to a trusted person. ? Spending time with friends and family. Safety  Always wear your seat belt while  driving or riding in a vehicle.  Do not drive: ? If you have been drinking alcohol. Do not ride with someone who has been drinking. ? When you are tired or distracted. ? While texting.  Wear a helmet and other protective equipment during sports activities.  If you have firearms in your house, make sure you follow all gun safety procedures.  Seek help if you have been bullied, physically abused, or sexually abused.  Use the Internet responsibly to avoid dangers, such as online bullying and online sex predators. What's next?  Go to your health care provider once a year for an annual wellness visit.  Ask your health care provider how often you should have your eyes and teeth checked.  Stay up to date on all vaccines. This information is not intended to replace advice given to you by your health care provider. Make sure you discuss any questions you have with your health care provider. Document Revised: 10/24/2019 Document Reviewed: 02/19/2018 Elsevier Patient Education  2021 [place].   Pap Test Why am I having this test? A Pap test, also called a Pap smear, is  a screening test to check for signs of:  Cancer of the vagina, cervix, and uterus. The cervix is the lower part of the uterus that opens into the vagina.  Infection.  Changes that may be a sign that cancer is developing (precancerous changes). Women need this test on a regular basis. In general, you should have a Pap test every 3 years until you reach menopause or age 54. Women aged 30-60 may choose to have their Pap test done at the same time as an HPV (human papillomavirus) test every 5 years (instead of every 3 years). Your health care provider may recommend having Pap tests more or less often depending on your medical conditions and past Pap test results. What kind of sample is taken? Your health care provider will collect a sample of cells from the surface of your cervix. This will be done using a small cotton  swab, plastic spatula, or brush. This sample is often collected during a pelvic exam, when you are lying on your back on an exam table with feet in footrests (stirrups). In some cases, fluids (secretions) from the cervix or vagina may also be collected.   How do I prepare for this test?  Be aware of where you are in your menstrual cycle. If you are menstruating on the day of the test, you may be asked to reschedule.  You may need to reschedule if you have a known vaginal infection on the day of the test.  Follow instructions from your health care provider about: ? Changing or stopping your regular medicines. Some medicines can cause abnormal test results, such as digitalis and tetracycline. ? Avoiding douching or taking a bath the day before or the day of the test. Tell a health care provider about:  Any allergies you have.  All medicines you are taking, including vitamins, herbs, eye drops, creams, and over-the-counter medicines.  Any blood disorders you have.  Any surgeries you have had.  Any medical conditions you have.  Whether you are pregnant or may be pregnant. How are the results reported? Your test results will be reported as either abnormal or normal. A false-positive result can occur. A false positive is incorrect because it means that a condition is present when it is not. A false-negative result can occur. A false negative is incorrect because it means that a condition is not present when it is. What do the results mean? A normal test result means that you do not have signs of cancer of the vagina, cervix, or uterus. An abnormal result may mean that you have:  Cancer. A Pap test by itself is not enough to diagnose cancer. You will have more tests done in this case.  Precancerous changes in your vagina, cervix, or uterus.  Inflammation of the cervix.  An STD (sexually transmitted disease).  A fungal infection.  A parasite infection. Talk with your health care  provider about what your results mean. Questions to ask your health care provider Ask your health care provider, or the department that is doing the test:  When will my results be ready?  How will I get my results?  What are my treatment options?  What other tests do I need?  What are my next steps? Summary  In general, women should have a Pap test every 3 years until they reach menopause or age 16.  Your health care provider will collect a sample of cells from the surface of your cervix. This will be done using a  small cotton swab, plastic spatula, or brush.  In some cases, fluids (secretions) from the cervix or vagina may also be collected. This information is not intended to replace advice given to you by your health care provider. Make sure you discuss any questions you have with your health care provider. Document Revised: 11/03/2019 Document Reviewed: 10/29/2019 Elsevier Patient Education  [place].

## 2020-10-06 ENCOUNTER — Ambulatory Visit (INDEPENDENT_AMBULATORY_CARE_PROVIDER_SITE_OTHER): Payer: BC Managed Care – PPO

## 2020-10-06 ENCOUNTER — Other Ambulatory Visit: Payer: Self-pay

## 2020-10-06 DIAGNOSIS — Z3042 Encounter for surveillance of injectable contraceptive: Secondary | ICD-10-CM

## 2020-10-06 MED ORDER — MEDROXYPROGESTERONE ACETATE 150 MG/ML IM SUSP
150.0000 mg | Freq: Once | INTRAMUSCULAR | Status: AC
  Administered 2020-10-06: 150 mg via INTRAMUSCULAR

## 2020-10-06 NOTE — Progress Notes (Cosign Needed)
Date last pap: underage. Last Depo-Provera: Jul 20, 2020. Side Effects if any: none. Serum HCG indicated? N/A. Depo-Provera 150 mg IM given by: Shawn Route, LPN. Next appointment due October 14-28, 2022.

## 2020-10-09 ENCOUNTER — Emergency Department
Admission: EM | Admit: 2020-10-09 | Discharge: 2020-10-09 | Disposition: A | Discharge status: Home / Self Care | Payer: BC Managed Care – PPO | Attending: Emergency Medicine | Admitting: Emergency Medicine

## 2020-10-09 ENCOUNTER — Ambulatory Visit: Payer: BC Managed Care – PPO

## 2020-10-09 ENCOUNTER — Other Ambulatory Visit: Payer: Self-pay

## 2020-10-09 DIAGNOSIS — Z20822 Contact with and (suspected) exposure to covid-19: Secondary | ICD-10-CM | POA: Diagnosis not present

## 2020-10-09 DIAGNOSIS — R112 Nausea with vomiting, unspecified: Secondary | ICD-10-CM | POA: Diagnosis not present

## 2020-10-09 DIAGNOSIS — E860 Dehydration: Secondary | ICD-10-CM | POA: Diagnosis not present

## 2020-10-09 DIAGNOSIS — R197 Diarrhea, unspecified: Secondary | ICD-10-CM | POA: Diagnosis not present

## 2020-10-09 DIAGNOSIS — R531 Weakness: Secondary | ICD-10-CM | POA: Diagnosis present

## 2020-10-09 LAB — URINALYSIS, COMPLETE (UACMP) WITH MICROSCOPIC
Bacteria, UA: NONE SEEN
Bilirubin Urine: NEGATIVE
Glucose, UA: NEGATIVE mg/dL
Hgb urine dipstick: NEGATIVE
Ketones, ur: NEGATIVE mg/dL
Leukocytes,Ua: NEGATIVE
Nitrite: NEGATIVE
Protein, ur: NEGATIVE mg/dL
Specific Gravity, Urine: 1.017 (ref 1.005–1.030)
pH: 9 — ABNORMAL HIGH (ref 5.0–8.0)

## 2020-10-09 LAB — BASIC METABOLIC PANEL
Anion gap: 7 (ref 5–15)
BUN: 6 mg/dL (ref 6–20)
CO2: 23 mmol/L (ref 22–32)
Calcium: 9.1 mg/dL (ref 8.9–10.3)
Chloride: 108 mmol/L (ref 98–111)
Creatinine, Ser: 0.73 mg/dL (ref 0.44–1.00)
GFR, Estimated: >60 mL/min (ref >60)
Glucose, Bld: 90 mg/dL (ref 70–99)
Potassium: 3.7 mmol/L (ref 3.5–5.1)
Sodium: 138 mmol/L (ref 135–145)

## 2020-10-09 LAB — CBC
HCT: 39.6 % (ref 36.0–46.0)
Hemoglobin: 12.6 g/dL (ref 12.0–15.0)
MCH: 28.8 pg (ref 26.0–34.0)
MCHC: 31.8 g/dL (ref 30.0–36.0)
MCV: 90.4 fL (ref 80.0–100.0)
Platelets: 364 10*3/uL (ref 150–400)
RBC: 4.38 MIL/uL (ref 3.87–5.11)
RDW: 13.4 % (ref 11.5–15.5)
WBC: 5.2 10*3/uL (ref 4.0–10.5)
nRBC: 0 % (ref 0.0–0.2)

## 2020-10-09 LAB — POC URINE PREG, ED: Preg Test, Ur: NEGATIVE

## 2020-10-09 LAB — LIPASE, BLOOD: Lipase: 21 U/L (ref 11–51)

## 2020-10-09 LAB — SARS CORONAVIRUS 2 (TAT 6-24 HRS): SARS Coronavirus 2: NEGATIVE

## 2020-10-09 MED ORDER — LACTATED RINGERS IV BOLUS
1000.0000 mL | Freq: Once | INTRAVENOUS | Status: AC
  Administered 2020-10-09: 1000 mL via INTRAVENOUS

## 2020-10-09 MED ORDER — LOPERAMIDE HCL 2 MG PO CAPS: 2.0000 mg | ORAL_CAPSULE | ORAL | 0 refills | Status: DC | PRN

## 2020-10-09 NOTE — ED Triage Notes (Signed)
Pt to ER via Pov with complaints of diarrhea/ weakness/ nausea x1 week. Reports being seen at [facility] x2 times in the past week for the same, was told she had food poisoning/ a viral illness. States she had some improvement in symptoms on Friday but her symptoms have returned. Reports having diarrhea all night, abdominal pain present when using the bathroom. Denies nausea/ vomiting. Pt also reports having diarrhea all night. Also reports feeling dehydrated but has been trying to drink Gatorade/ other fluids.   Unknown covid contacts.

## 2020-10-09 NOTE — ED Provider Notes (Signed)
[facility] Emergency Department Provider Note   ____________________________________________   Event Date/Time   First MD Initiated Contact with Patient 10/09/20 1015     (approximate)  I have reviewed the triage vital signs and the nursing notes.   HISTORY  Chief Complaint Weakness    HPI Rachel Garner is a 20 y.o. female with no significant past medical history presents to the ED complaining of weakness and diarrhea.  Patient reports that she initially developed nausea, vomiting, and diarrhea about 6 days ago.  She was evaluated at the walk-in clinic, told to stay hydrated with plenty of fluids.  Nausea and vomiting has improved since then but she continues to have diarrhea, states that she has been feeling increasingly weak.  She denies any fevers, cough, chest pain, or shortness of breath.  She has been keeping down fluids, but has not taken anything for her diarrhea.  She denies any recent sick contacts, is requesting COVID-19 testing in order to go back to work.        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 Authorizing Provider  loperamide (IMODIUM) 2 MG capsule Take 1 capsule (2 mg total) by mouth as needed for diarrhea or loose stools. 10/09/20  Yes Blake Divine, MD  albuterol (VENTOLIN HFA) 108 (90 Base) MCG/ACT inhaler Inhale 2 puffs into the lungs every 4 (four) hours as needed for wheezing or shortness of breath. 07/05/18   Alfred Levins, [place], MD  medroxyPROGESTERone Acetate 150 MG/ML SUSY INJECT 1 ML (150 MG TOTAL) INTO THE MUSCLE EVERY 3 MONTHS  07/20/20   Verdene Rio, Lara Mulch, CNM    Allergies Patient has no known allergies.  Family History  Problem Relation Age of Onset   Alcohol abuse Mother    Depression Mother    Breast cancer Mother 61       HER1   Alcohol abuse Father    Heart disease Maternal Grandmother    Skin cancer Maternal Grandmother    Thyroid disease Maternal Grandmother    Colon cancer Maternal Grandmother    Multiple sclerosis Paternal Grandmother    Heart disease Paternal Grandfather    Stroke Neg Hx    Ovarian cancer Neg Hx     Social History Social History   Tobacco Use   Smoking status: Never   Smokeless tobacco: Never  Vaping Use   Vaping Use: Never used  Substance Use Topics   Alcohol use: No   Drug use: Yes    Frequency: 4.0 times per week    Types: Marijuana    Review of Systems  Constitutional: No fever/chills.  Positive for generalized weakness. Eyes: No visual changes. ENT: No sore throat. Cardiovascular: Denies chest pain. Respiratory: Denies shortness of breath. Gastrointestinal: No abdominal pain.  No nausea, no vomiting.  Positive for diarrhea.  No constipation. Genitourinary: Negative for dysuria. Musculoskeletal: Negative for back pain. Skin: Negative for rash. Neurological: Negative for headaches, focal weakness or numbness.  ____________________________________________   PHYSICAL EXAM:  VITAL SIGNS: ED Triage Vitals [10/09/20 0933]  Enc Vitals Group  BP 130/81     Pulse Rate 85     Resp 18     Temp 98.7 F (37.1 C)     Temp Source Oral     SpO2 99 %     Weight 188 lb (85.3 kg)     Height '5\' 5"'  (1.651 m)     Head Circumference      Peak Flow      Pain Score 5     Pain Loc      Pain Edu?      Excl. in [place]?     Constitutional: Alert and oriented. Eyes: Conjunctivae are normal. Head: Atraumatic. Nose: No congestion/rhinnorhea. Mouth/Throat: Mucous membranes are moist. Neck: Normal ROM Cardiovascular: Normal rate, regular rhythm. Grossly  normal heart sounds.  2+ radial pulses bilaterally. Respiratory: Normal respiratory effort.  No retractions. Lungs CTAB. Gastrointestinal: Soft and nontender. No distention. Genitourinary: deferred Musculoskeletal: No lower extremity tenderness nor edema. Neurologic:  Normal speech and language. No gross focal neurologic deficits are appreciated. Skin:  Skin is warm, dry and intact. No rash noted. Psychiatric: Mood and affect are normal. Speech and behavior are normal.  ____________________________________________   LABS (all labs ordered are listed, but only abnormal results are displayed)  Labs Reviewed  URINALYSIS, COMPLETE (UACMP) WITH MICROSCOPIC - Abnormal; Notable for the following components:      Result Value   Color, Urine YELLOW (*)    APPearance CLEAR (*)    pH 9.0 (*)    All other components within normal limits  SARS CORONAVIRUS 2 (TAT 6-24 HRS)  BASIC METABOLIC PANEL  CBC  LIPASE, BLOOD  POC URINE PREG, ED   ____________________________________________  EKG  ED ECG REPORT I, Blake Divine, the attending physician, personally viewed and interpreted this ECG.   Date: 10/09/2020  EKG Time: 9:39  Rate: 72  Rhythm: normal sinus rhythm  Axis: Normal  Intervals:none  ST&T Change: None   PROCEDURES  Procedure(s) performed (including Critical Care):  Procedures   ____________________________________________   INITIAL IMPRESSION / ASSESSMENT AND PLAN / ED COURSE      20 year old female with no significant past medical history presents to the ED complaining of nausea and vomiting starting last week, vomiting has improved since then but she has ongoing diarrhea.  Patient is overall well-appearing, labs are reassuring with no significant electrolyte abnormality or AKI.  We will hydrate with IV fluids, pregnancy testing and UA are pending.  Suspect her symptoms are due to gastroenteritis, no abdominal tenderness noted.  Patient feeling better following IV  fluids, pregnancy testing is negative and UA shows no signs of infection.  She is appropriate for discharge home and we will prescribe loperamide to take as needed for diarrhea.  She was counseled to drink plenty of fluids and to follow-up with her PCP, otherwise return to the ED for new worsening symptoms.  Patient agrees with plan.      ____________________________________________   FINAL CLINICAL IMPRESSION(S) / ED DIAGNOSES  Final diagnoses:  Dehydration  Diarrhea, unspecified type     ED Discharge Orders          Ordered    loperamide (IMODIUM) 2 MG capsule  As needed        10/09/20 1341             Note:  This document was prepared using Dragon voice recognition software and may include unintentional dictation errors.    Blake Divine, MD 10/09/20 1343

## 2020-10-09 NOTE — ED Notes (Signed)
See triage note, pt reports diarrhea and vomiting since Tuesday, feeling generalized weakness. Thought had food poisoning. Sx started approx 30 mins after eating [employer].  Pt ambulatory, nad noted, speech clear, alert and oriented

## 2020-10-10 ENCOUNTER — Emergency Department
Admission: EM | Admit: 2020-10-10 | Discharge: 2020-10-10 | Disposition: A | Discharge status: Home / Self Care | Payer: BC Managed Care – PPO | Attending: Emergency Medicine | Admitting: Emergency Medicine

## 2020-10-10 ENCOUNTER — Other Ambulatory Visit: Payer: Self-pay

## 2020-10-10 DIAGNOSIS — R519 Headache, unspecified: Secondary | ICD-10-CM | POA: Insufficient documentation

## 2020-10-10 DIAGNOSIS — R42 Dizziness and giddiness: Secondary | ICD-10-CM | POA: Insufficient documentation

## 2020-10-10 DIAGNOSIS — R55 Syncope and collapse: Secondary | ICD-10-CM | POA: Diagnosis present

## 2020-10-10 LAB — COMPREHENSIVE METABOLIC PANEL WITH GFR
ALT: 14 U/L (ref 0–44)
AST: 17 U/L (ref 15–41)
Albumin: 3.8 g/dL (ref 3.5–5.0)
Alkaline Phosphatase: 86 U/L (ref 38–126)
Anion gap: 8 (ref 5–15)
BUN: <5 mg/dL — ABNORMAL LOW (ref 6–20)
CO2: 20 mmol/L — ABNORMAL LOW (ref 22–32)
Calcium: 9.3 mg/dL (ref 8.9–10.3)
Chloride: 111 mmol/L (ref 98–111)
Creatinine, Ser: 0.55 mg/dL (ref 0.44–1.00)
GFR, Estimated: >60 mL/min
Glucose, Bld: 110 mg/dL — ABNORMAL HIGH (ref 70–99)
Potassium: 3.7 mmol/L (ref 3.5–5.1)
Sodium: 139 mmol/L (ref 135–145)
Total Bilirubin: 0.6 mg/dL (ref 0.3–1.2)
Total Protein: 7.3 g/dL (ref 6.5–8.1)

## 2020-10-10 LAB — CBC
HCT: 37.5 % (ref 36.0–46.0)
Hemoglobin: 12.4 g/dL (ref 12.0–15.0)
MCH: 28.7 pg (ref 26.0–34.0)
MCHC: 33.1 g/dL (ref 30.0–36.0)
MCV: 86.8 fL (ref 80.0–100.0)
Platelets: 359 K/uL (ref 150–400)
RBC: 4.32 MIL/uL (ref 3.87–5.11)
RDW: 13.6 % (ref 11.5–15.5)
WBC: 6.4 K/uL (ref 4.0–10.5)
nRBC: 0 % (ref 0.0–0.2)

## 2020-10-10 NOTE — ED Triage Notes (Signed)
Pt to ED POV for syncopal episodes x3 this am. Seen yesterday for emesis, weakness.  Denies v/d today Unsure of hitting head with syncope, c/o headache and bodyaches Ambulatory to triage

## 2020-10-10 NOTE — ED Triage Notes (Signed)
Was seen yesterday for weakness and diarrhea  was given IV fluids   states this am she passed out 3 times at home this am

## 2020-10-10 NOTE — ED Provider Notes (Signed)
[facility] Emergency Department Provider Note   ____________________________________________   Event Date/Time   First MD Initiated Contact with Patient 10/10/20 [phone]     (approximate)  I have reviewed the triage vital signs and the nursing notes.   HISTORY  Chief Complaint Loss of Consciousness    HPI Rachel Garner is a 20 y.o. female with no significant past medical history presents to the ED complaining of syncope.  Patient reports that she got up early this morning and attempted to walk to the bathroom.  She then began to feel lightheaded, states she eventually passed out and fell to the ground.  She is not sure whether she hit her head, does now complain of a right frontal headache but denies any neck pain.  She was able to stand up and make it to the bathroom, but after the using the toilet continued to feel lightheaded.  She states she lowered herself to the ground and believes she lost consciousness briefly for a second time.  She denies any associated chest pain or shortness of breath.  She states she has passed out before with similar symptoms.  She was seen in the ED yesterday for recent vomiting and diarrhea, states she has not had any more vomiting or diarrhea since then.  She denies any recent fevers.        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 Authorizing Provider  albuterol (VENTOLIN HFA) 108 (90 Base) MCG/ACT inhaler Inhale 2 puffs into the lungs every 4 (four) hours as needed for wheezing or shortness of breath. 07/05/18   Alfred Levins, [place],  MD  loperamide (IMODIUM) 2 MG capsule Take 1 capsule (2 mg total) by mouth as needed for diarrhea or loose stools. 10/09/20   Blake Divine, MD  medroxyPROGESTERone Acetate 150 MG/ML SUSY INJECT 1 ML (150 MG TOTAL) INTO THE MUSCLE EVERY 3 MONTHS 07/20/20   Verdene Rio, Lara Mulch, CNM    Allergies Patient has no known allergies.  Family History  Problem Relation Age of Onset   Alcohol abuse Mother    Depression Mother    Breast cancer Mother 42       HER1   Alcohol abuse Father    Heart disease Maternal Grandmother    Skin cancer Maternal Grandmother    Thyroid disease Maternal Grandmother    Colon cancer Maternal Grandmother    Multiple sclerosis Paternal Grandmother    Heart disease Paternal Grandfather    Stroke Neg Hx    Ovarian cancer Neg Hx     Social History Social History   Tobacco Use   Smoking status: Never   Smokeless tobacco: Never  Vaping Use   Vaping Use: Never used  Substance Use Topics   Alcohol use: No   Drug use: Yes    Frequency: 4.0 times per week    Types: Marijuana    Review of Systems  Constitutional: No fever/chills Eyes: No visual changes. ENT: No sore throat. Cardiovascular: Denies chest pain.  Positive for syncope. Respiratory: Denies shortness of breath. Gastrointestinal: No  abdominal pain.  No nausea, no vomiting.  No diarrhea.  No constipation. Genitourinary: Negative for dysuria. Musculoskeletal: Negative for back pain. Skin: Negative for rash. Neurological: Negative for headaches, focal weakness or numbness.  ____________________________________________   PHYSICAL EXAM:  VITAL SIGNS: ED Triage Vitals  Enc Vitals Group     BP 10/10/20 0758 125/70     Pulse Rate 10/10/20 0758 86     Resp 10/10/20 0758 20     Temp 10/10/20 0758 98.5 F (36.9 C)     Temp Source 10/10/20 0758 Oral     SpO2 10/10/20 0758 99 %     Weight 10/10/20 0800 187 lb 6.3 oz (85 kg)     Height 10/10/20 0800 '5\' 5"'  (1.651 m)     Head Circumference --       Peak Flow --      Pain Score 10/10/20 0759 7     Pain Loc --      Pain Edu? --      Excl. in [place]? --     Constitutional: Alert and oriented. Eyes: Conjunctivae are normal. Head: Atraumatic. Nose: No congestion/rhinnorhea. Mouth/Throat: Mucous membranes are moist. Neck: Normal ROM Cardiovascular: Normal rate, regular rhythm. Grossly normal heart sounds.  2+ radial pulses bilaterally. Respiratory: Normal respiratory effort.  No retractions. Lungs CTAB. Gastrointestinal: Soft and nontender. No distention. Genitourinary: deferred Musculoskeletal: No lower extremity tenderness nor edema. Neurologic:  Normal speech and language. No gross focal neurologic deficits are appreciated. Skin:  Skin is warm, dry and intact. No rash noted. Psychiatric: Mood and affect are normal. Speech and behavior are normal.  ____________________________________________   LABS (all labs ordered are listed, but only abnormal results are displayed)  Labs Reviewed  COMPREHENSIVE METABOLIC PANEL - Abnormal; Notable for the following components:      Result Value   CO2 20 (*)    Glucose, Bld 110 (*)    BUN <5 (*)    All other components within normal limits  CBC   ____________________________________________  EKG  ED ECG REPORT I, Blake Divine, the attending physician, personally viewed and interpreted this ECG.   Date: 10/10/2020  EKG Time: 8:04  Rate: 81  Rhythm: normal sinus rhythm  Axis: Normal  Intervals:none  ST&T Change: None   PROCEDURES  Procedure(s) performed (including Critical Care):  Procedures   ____________________________________________   INITIAL IMPRESSION / ASSESSMENT AND PLAN / ED COURSE      19 year old female with no significant past medical history presents to the ED complaining of 2 separate syncopal episodes after she attempted to walk to the bathroom this morning.  Symptoms were preceded by lightheadedness and patient has had recent vomiting and  diarrhea, low suspicion for cardiac etiology of her syncope and I suspect hypovolemia versus vasovagal episode.  EKG shows no evidence of arrhythmia or ischemia, labs are unremarkable.  Patient is tolerating p.o. at this time and we will hold off on IV fluids.  Pregnancy testing negative yesterday.  Patient is appropriate for discharge home with PCP follow-up, states she has an appointment scheduled in 2 days.  She was counseled to return to the ED for new worsening symptoms, patient agrees with plan.      ____________________________________________   FINAL CLINICAL IMPRESSION(S) / ED DIAGNOSES  Final diagnoses:  Syncope and collapse     ED Discharge Orders     None        Note:  This document was prepared using Dragon voice recognition software and may include unintentional  dictation errors.    Blake Divine, MD 10/10/20 [phone]

## 2020-12-25 ENCOUNTER — Ambulatory Visit (INDEPENDENT_AMBULATORY_CARE_PROVIDER_SITE_OTHER): Payer: BC Managed Care – PPO | Admitting: Obstetrics and Gynecology

## 2020-12-25 ENCOUNTER — Other Ambulatory Visit: Payer: Self-pay

## 2020-12-25 ENCOUNTER — Ambulatory Visit: Payer: BC Managed Care – PPO

## 2020-12-25 VITALS — BP 126/73 | HR 82 | Temp 98.6°F | Resp 16 | Ht 65.0 in | Wt 202.0 lb

## 2020-12-25 DIAGNOSIS — Z3042 Encounter for surveillance of injectable contraceptive: Secondary | ICD-10-CM | POA: Diagnosis not present

## 2020-12-25 DIAGNOSIS — R3915 Urgency of urination: Secondary | ICD-10-CM | POA: Diagnosis not present

## 2020-12-25 MED ORDER — MEDROXYPROGESTERONE ACETATE 150 MG/ML IM SUSP
150.0000 mg | Freq: Once | INTRAMUSCULAR | Status: AC
  Administered 2020-12-25: 150 mg via INTRAMUSCULAR

## 2020-12-25 NOTE — Progress Notes (Signed)
Date last pap: Never done: Due. Last Depo-Provera: 10/06/2020. Side Effects if any: None. Serum HCG indicated? N/A. Depo-Provera 150 mg IM given by: Damone Fancher B, CMA. Next appointment due Dec. 30 - Jan. 13    During her nurse visit for depo injection. Patient complains of urinary urgency, burning and frequency. She has been drinking cranberry juice to see if it will help relieve symptoms, but no relief. Instructed patient to increased water intake, continue drinking cranberry juice and add AZO. POC urine dip was done and it was negative for leukocytes, blood and nitrates. Culture collected and sent for testing.

## 2020-12-27 LAB — URINE CULTURE

## 2021-01-01 LAB — POCT URINALYSIS DIPSTICK
Bilirubin, UA: NEGATIVE
Blood, UA: NEGATIVE
Glucose, UA: NEGATIVE
Ketones, UA: NEGATIVE
Leukocytes, UA: NEGATIVE
Nitrite, UA: NEGATIVE
Protein, UA: NEGATIVE
Urobilinogen, UA: 0.2 E.U./dL

## 2021-03-13 ENCOUNTER — Ambulatory Visit: Payer: BC Managed Care – PPO

## 2021-03-16 ENCOUNTER — Ambulatory Visit (INDEPENDENT_AMBULATORY_CARE_PROVIDER_SITE_OTHER): Payer: BC Managed Care – PPO | Admitting: Obstetrics and Gynecology

## 2021-03-16 ENCOUNTER — Other Ambulatory Visit: Payer: Self-pay

## 2021-03-16 ENCOUNTER — Ambulatory Visit: Payer: BC Managed Care – PPO

## 2021-03-16 ENCOUNTER — Encounter: Payer: Self-pay | Admitting: Obstetrics and Gynecology

## 2021-03-16 DIAGNOSIS — Z3042 Encounter for surveillance of injectable contraceptive: Secondary | ICD-10-CM

## 2021-03-16 MED ORDER — MEDROXYPROGESTERONE ACETATE 150 MG/ML IM SUSP
150.0000 mg | Freq: Once | INTRAMUSCULAR | Status: AC
  Administered 2021-03-16: 150 mg via INTRAMUSCULAR

## 2021-03-16 NOTE — Progress Notes (Signed)
Pt presents for routine depo injection, no adverse reactions. All questions answered.  Date last pap: NA. Last Depo-Provera: 12/25/20. Side Effects if any: NA. Serum HCG indicated? NA. Depo-Provera 150 mg IM given by: Roddie Mc, CMA. Next appointment due March 24 - June 15 2021.

## 2021-05-10 IMAGING — US US PELVIS COMPLETE TRANSABD/TRANSVAG W DUPLEX
1 series · 13 of 25 positions shown · non-contrast
Comparison: None.

CLINICAL DATA: 18-year-old female with lower abdominal pain.

EXAM:
TRANSABDOMINAL AND TRANSVAGINAL ULTRASOUND OF PELVIS
DOPPLER ULTRASOUND OF OVARIES
TECHNIQUE: Both transabdominal and transvaginal ultrasound examinations of the
pelvis were performed. Transabdominal technique was performed for
global imaging of the pelvis including uterus, ovaries, adnexal
regions, and pelvic cul-de-sac.
It was necessary to proceed with endovaginal exam following the
transabdominal exam to visualize the endometrium and ovaries. Color
and duplex Doppler ultrasound was utilized to evaluate blood flow to
the ovaries.

[Series 1: us pelvic complete w transvaginal and torsion righ · 13 of 107 slices shown]
[im 1/107]
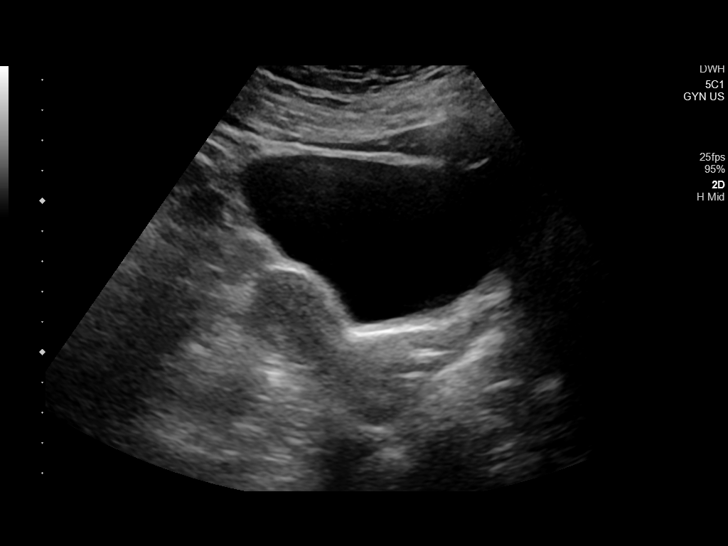
[im 9/107]
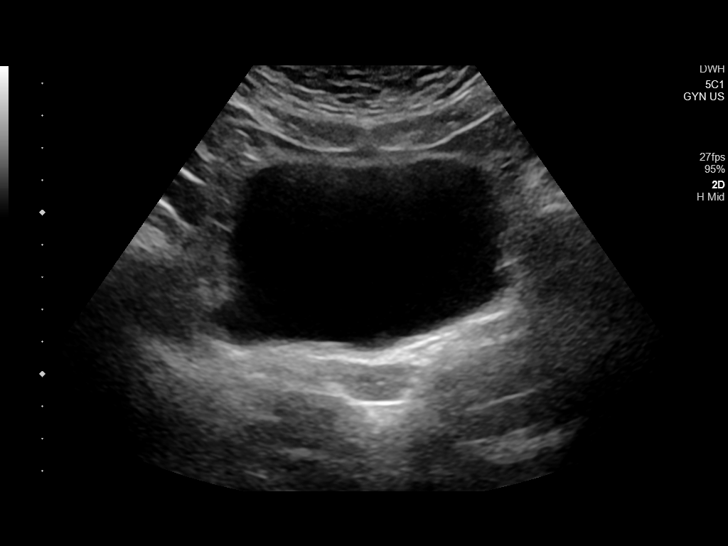
[im 18/107]
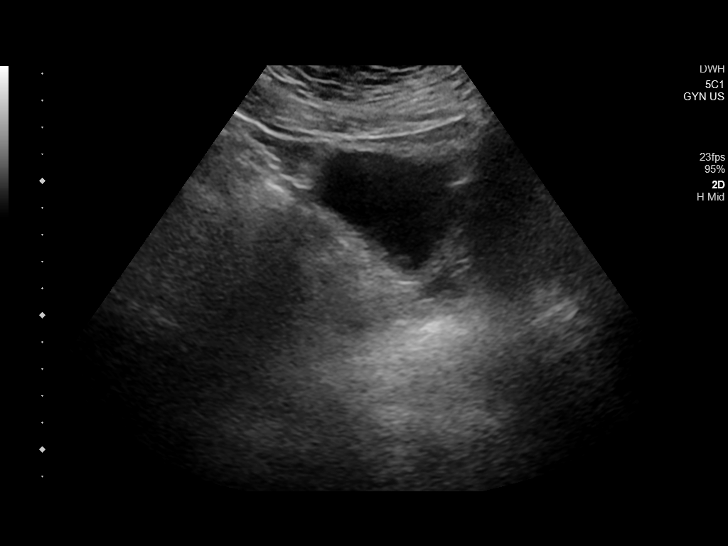
[im 27/107]
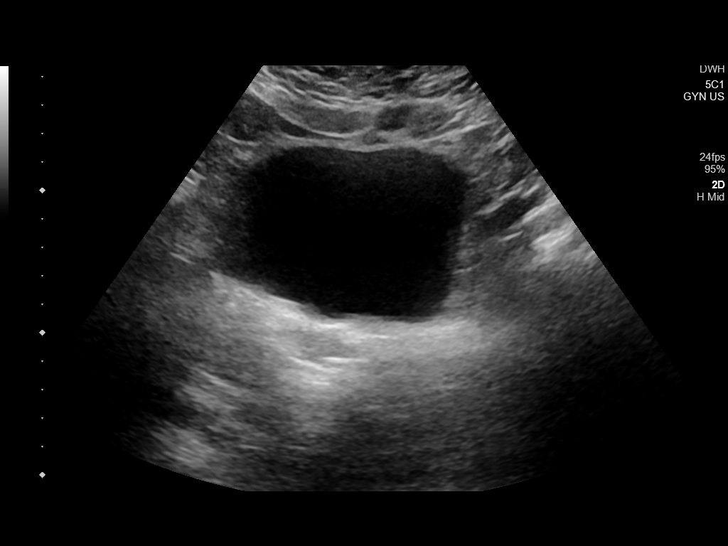
[im 36/107]
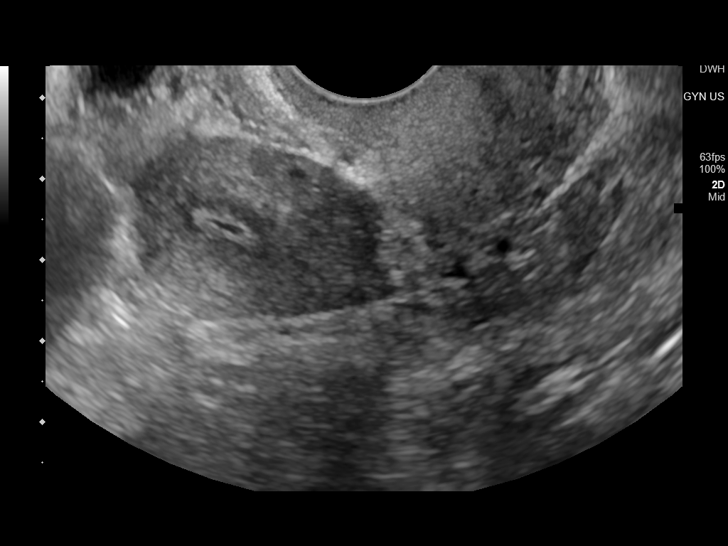
[im 45/107]
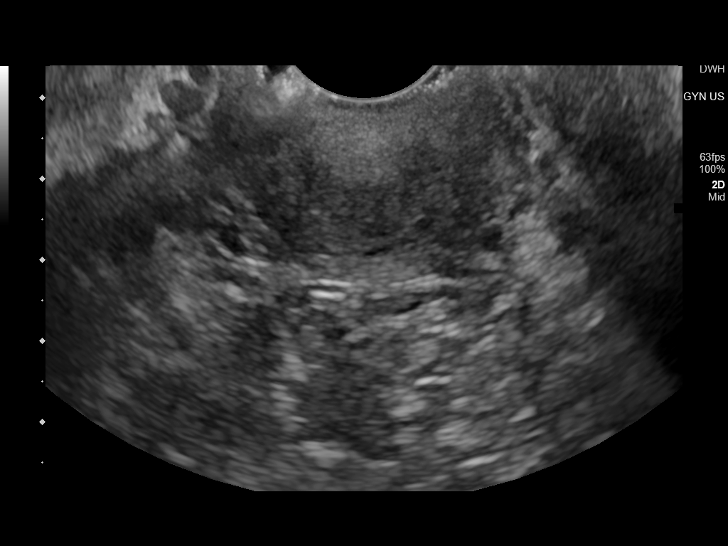
[im 54/107]
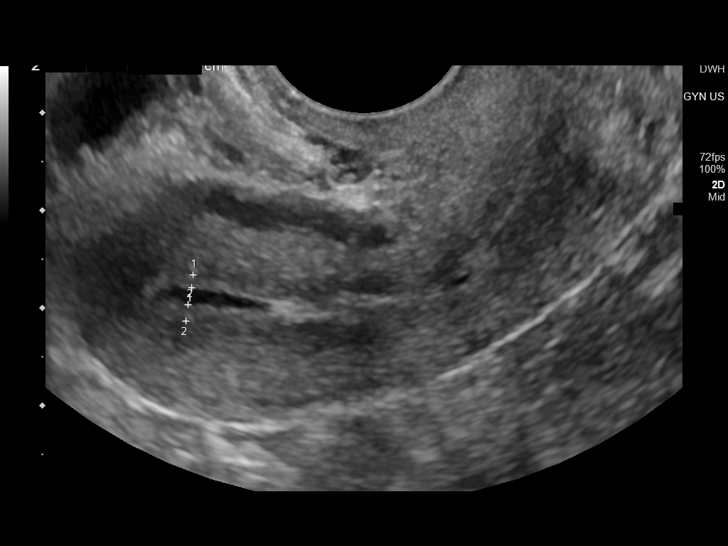
[im 62/107]
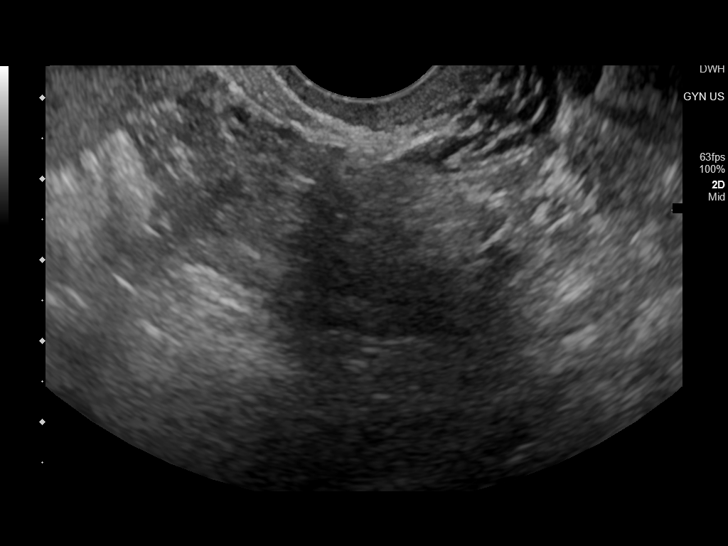
[im 71/107]
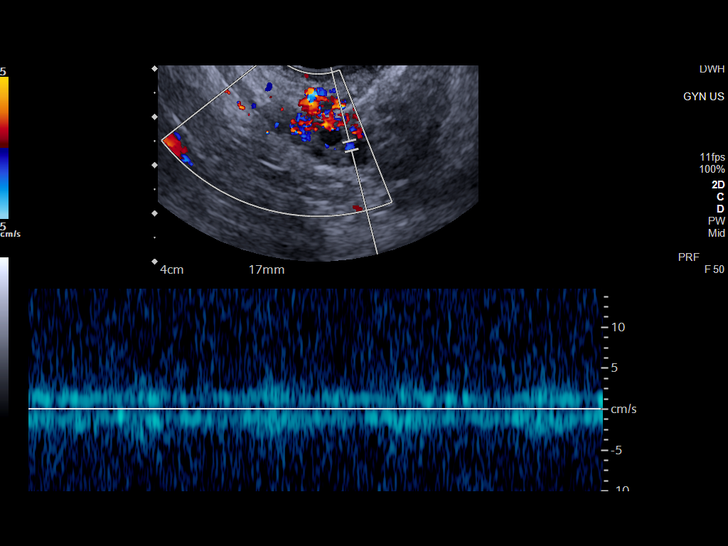
[im 80/107]
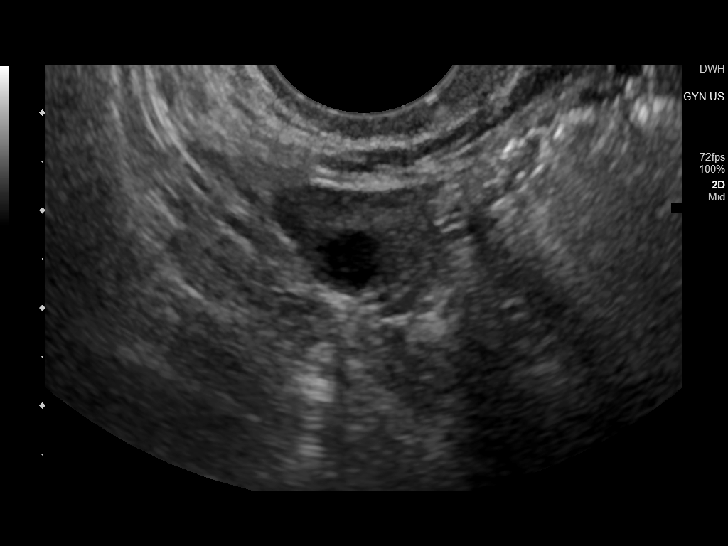
[im 89/107]
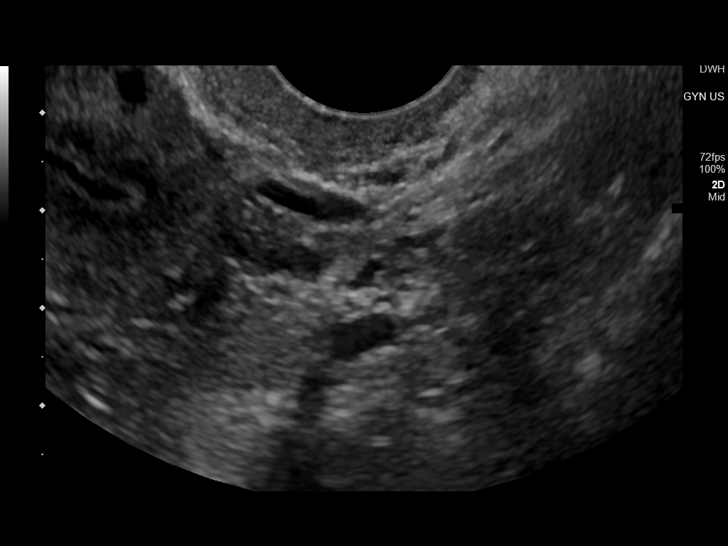
[im 98/107]
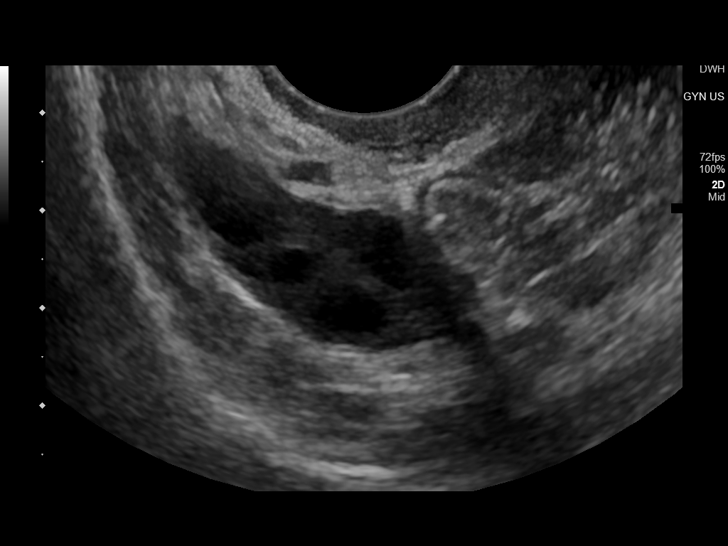
[im 107/107]
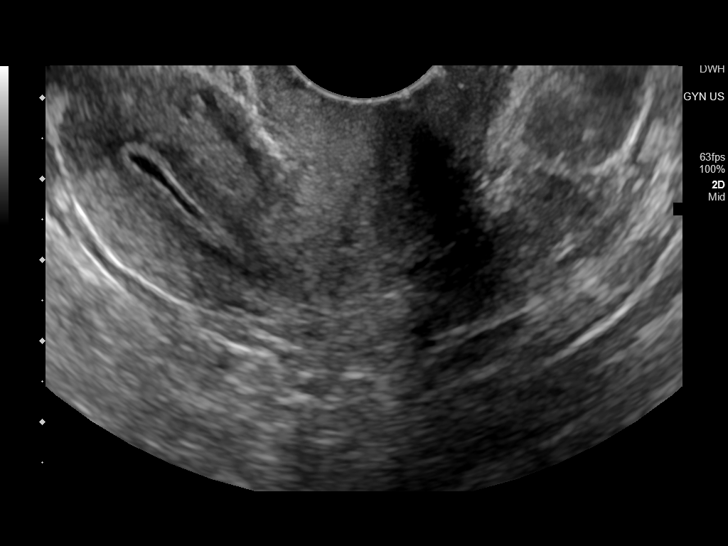

[13 of 25 positions shown; findings below may reference images not displayed]

FINDINGS: Uterus

Measurements: 6.5 x 2.7 x 3.4 cm = volume: 31 mL. The uterus is
anteverted and appears unremarkable. No fibroids or other mass
visualized.

Endometrium

Thickness: 3 mm. No focal abnormality visualized. Small fluid noted
within the endometrium, likely physiologic.

Right ovary

Measurements: 3.9 x 1.3 x 2.0 cm = volume: 5 mL. Normal
appearance/no adnexal mass.

Left ovary

Measurements: 3.7 x 1.3 x 2.2 cm = volume: 6 mL. Normal
appearance/no adnexal mass.

Pulsed Doppler evaluation of both ovaries demonstrates normal
low-resistance arterial and venous waveforms.

Other findings

No abnormal free fluid.
IMPRESSION: Unremarkable pelvic ultrasound.

## 2021-06-01 ENCOUNTER — Ambulatory Visit (INDEPENDENT_AMBULATORY_CARE_PROVIDER_SITE_OTHER): Payer: BC Managed Care – PPO | Admitting: Obstetrics and Gynecology

## 2021-06-01 ENCOUNTER — Other Ambulatory Visit: Payer: Self-pay

## 2021-06-01 DIAGNOSIS — Z3042 Encounter for surveillance of injectable contraceptive: Secondary | ICD-10-CM

## 2021-06-01 MED ORDER — MEDROXYPROGESTERONE ACETATE 150 MG/ML IM SUSP
150.0000 mg | INTRAMUSCULAR | Status: AC
  Administered 2021-06-01 – 2022-02-08 (×2): 150 mg via INTRAMUSCULAR

## 2021-06-01 NOTE — Progress Notes (Signed)
Date last pap: Never done ?Last Depo-Provera: 03/16/2021. ?Side Effects if any: None. ?Serum HCG indicated? N/A. ?Depo-Provera 150 mg IM given by: Santiago Bumpers, CMA. ?Next appointment due: June 9 - June 23.  ?

## 2021-07-25 ENCOUNTER — Encounter: Payer: BC Managed Care – PPO | Admitting: Certified Nurse Midwife

## 2021-08-20 ENCOUNTER — Ambulatory Visit: Payer: BC Managed Care – PPO

## 2021-08-23 ENCOUNTER — Telehealth: Payer: Self-pay | Admitting: Certified Nurse Midwife

## 2021-08-23 ENCOUNTER — Other Ambulatory Visit: Payer: Self-pay

## 2021-08-23 NOTE — Telephone Encounter (Signed)
Pt states the RX is out of refills at the pharmacy and she needs to pick up her DEPO for tomorrows scheduled injection. Please return call

## 2021-08-23 NOTE — Telephone Encounter (Signed)
Called she will pick up RX for 08/24/21 - Depo Injection appt

## 2021-08-24 ENCOUNTER — Ambulatory Visit (INDEPENDENT_AMBULATORY_CARE_PROVIDER_SITE_OTHER): Payer: BC Managed Care – PPO | Admitting: Certified Nurse Midwife

## 2021-08-24 DIAGNOSIS — Z3042 Encounter for surveillance of injectable contraceptive: Secondary | ICD-10-CM

## 2021-08-24 MED ORDER — MEDROXYPROGESTERONE ACETATE 150 MG/ML IM SUSP
150.0000 mg | Freq: Once | INTRAMUSCULAR | Status: AC
  Administered 2021-08-24: 150 mg via INTRAMUSCULAR

## 2021-08-24 NOTE — Progress Notes (Unsigned)
Date last pap: Never done Last Depo-Provera: 06/01/2021. Side Effects if any: None. Serum HCG indicated? N/A. Depo-Provera 150 mg IM given by: Beverely Pace CMA. Next appointment due: SEPT 1- SEPT 15

## 2021-10-22 ENCOUNTER — Encounter: Payer: BC Managed Care – PPO | Admitting: Certified Nurse Midwife

## 2021-11-16 ENCOUNTER — Ambulatory Visit: Payer: BC Managed Care – PPO

## 2021-11-16 ENCOUNTER — Ambulatory Visit (INDEPENDENT_AMBULATORY_CARE_PROVIDER_SITE_OTHER): Payer: BC Managed Care – PPO | Admitting: Certified Nurse Midwife

## 2021-11-16 ENCOUNTER — Other Ambulatory Visit (HOSPITAL_COMMUNITY)
Admission: RE | Admit: 2021-11-16 | Discharge: 2021-11-16 | Disposition: A | Discharge status: Home / Self Care | Payer: BC Managed Care – PPO | Source: Ambulatory Visit | Attending: Certified Nurse Midwife | Admitting: Certified Nurse Midwife

## 2021-11-16 ENCOUNTER — Encounter: Payer: Self-pay | Admitting: Certified Nurse Midwife

## 2021-11-16 VITALS — BP 116/79 | HR 79 | Ht 65.0 in | Wt 200.9 lb

## 2021-11-16 DIAGNOSIS — Z124 Encounter for screening for malignant neoplasm of cervix: Secondary | ICD-10-CM

## 2021-11-16 DIAGNOSIS — N898 Other specified noninflammatory disorders of vagina: Secondary | ICD-10-CM

## 2021-11-16 DIAGNOSIS — Z01419 Encounter for gynecological examination (general) (routine) without abnormal findings: Secondary | ICD-10-CM

## 2021-11-16 DIAGNOSIS — Z3042 Encounter for surveillance of injectable contraceptive: Secondary | ICD-10-CM

## 2021-11-16 DIAGNOSIS — N760 Acute vaginitis: Secondary | ICD-10-CM | POA: Insufficient documentation

## 2021-11-16 DIAGNOSIS — B3731 Acute candidiasis of vulva and vagina: Secondary | ICD-10-CM | POA: Insufficient documentation

## 2021-11-16 MED ORDER — MEDROXYPROGESTERONE ACETATE 150 MG/ML IM SUSY: PREFILLED_SYRINGE | INTRAMUSCULAR | 3 refills | Status: DC

## 2021-11-16 MED ORDER — MEDROXYPROGESTERONE ACETATE 150 MG/ML IM SUSP
150.0000 mg | Freq: Once | INTRAMUSCULAR | Status: AC
  Administered 2021-11-16: 150 mg via INTRAMUSCULAR

## 2021-11-16 NOTE — Progress Notes (Signed)
GYNECOLOGY ANNUAL PREVENTATIVE CARE ENCOUNTER NOTE  History:     Rachel Garner is a 21 y.o. G0P0000 female here for a routine annual gynecologic exam.  Current complaints: vaginal odor.   Denies abnormal vaginal bleeding, discharge, pelvic pain, problems with intercourse or other gynecologic concerns.     Social Relationship: female partner Living: with partner 2 cats Work: Financial controller Exercise: 1 x wk, very active at work  Smoke/Alcohol/drug use: denies smoking/vaping, alcohol use. Marijuana use nightly   Gynecologic History No LMP recorded. Patient has had an injection. Contraception: Depo-Provera injections Last Pap: has not had.  Last mammogram: n/a    The pregnancy intention screening data noted above was reviewed. Potential methods of contraception were discussed. The patient elected to proceed with Depo.   Obstetric History OB History  Gravida Para Term Preterm AB Living  0 0 0 0 0 0  SAB IAB Ectopic Multiple Live Births  0 0 0 0 0    Past Medical History:  Diagnosis Date   Allergy    seasonal (ragweed, "pigweed")    Past Surgical History:  Procedure Laterality Date   NO PAST SURGERIES      Current Outpatient Medications on File Prior to Visit  Medication Sig Dispense Refill   albuterol (VENTOLIN HFA) 108 (90 Base) MCG/ACT inhaler Inhale 2 puffs into the lungs every 4 (four) hours as needed for wheezing or shortness of breath. 1 Inhaler 0   loperamide (IMODIUM) 2 MG capsule Take 1 capsule (2 mg total) by mouth as needed for diarrhea or loose stools. 30 capsule 0   medroxyPROGESTERone Acetate 150 MG/ML SUSY INJECT 1 ML (150 MG TOTAL) INTO THE MUSCLE EVERY 3 MONTHS 1 mL 3   Current Facility-Administered Medications on File Prior to Visit  Medication Dose Route Frequency Provider Last Rate Last Admin   medroxyPROGESTERone (DEPO-PROVERA) injection 150 mg  150 mg Intramuscular Q90 days Rubie Maid, MD   150 mg at 06/01/21 0857     No Known Allergies  Social History:  reports that she has never smoked. She has never used smokeless tobacco. She reports current drug use. Frequency: 4.00 times per week. Drug: Marijuana. She reports that she does not drink alcohol.  Family History  Problem Relation Age of Onset   Alcohol abuse Mother    Depression Mother    Breast cancer Mother 74       HER1   Alcohol abuse Father    Heart disease Maternal Grandmother    Skin cancer Maternal Grandmother    Thyroid disease Maternal Grandmother    Colon cancer Maternal Grandmother    Multiple sclerosis Paternal Grandmother    Heart disease Paternal Grandfather    Stroke Neg Hx    Ovarian cancer Neg Hx     The following portions of the patient's history were reviewed and updated as appropriate: allergies, current medications, past family history, past medical history, past social history, past surgical history and problem list.  Review of Systems Pertinent items noted in HPI and remainder of comprehensive ROS otherwise negative.  Physical Exam:  BP 116/79   Pulse 79   Ht 5' 5" (1.651 m)   Wt 200 lb 14.4 oz (91.1 kg)   BMI 33.43 kg/m  CONSTITUTIONAL: Well-developed, well-nourished female in no acute distress.  HENT:  Normocephalic, atraumatic, External right and left ear normal. Oropharynx is clear and moist EYES: Conjunctivae and EOM are normal. Pupils are equal, round, and reactive to light. No  scleral icterus.  NECK: Normal range of motion, supple, no masses.  Normal thyroid.  SKIN: Skin is warm and dry. No rash noted. Not diaphoretic. No erythema. No pallor. MUSCULOSKELETAL: Normal range of motion. No tenderness.  No cyanosis, clubbing, or edema.  2+ distal pulses. NEUROLOGIC: Alert and oriented to person, place, and time. Normal reflexes, muscle tone coordination.  PSYCHIATRIC: Normal mood and affect. Normal behavior. Normal judgment and thought content. CARDIOVASCULAR: Normal heart rate noted, regular  rhythm RESPIRATORY: Clear to auscultation bilaterally. Effort and breath sounds normal, no problems with respiration noted. BREASTS: Symmetric in size. No masses, tenderness, skin changes, nipple drainage, or lymphadenopathy bilaterally.  ABDOMEN: Soft, no distention noted.  No tenderness, rebound or guarding.  PELVIC: Normal appearing external genitalia and urethral meatus; normal appearing vaginal mucosa and cervix.  No abnormal discharge noted. Abnormal odor noted.  Pap smear obtained.  Normal uterine size, no other palpable masses, no uterine or adnexal tenderness.  .   Assessment and Plan:    1. Well woman exam with routine gynecological exam    Pap: Will follow up results of pap smear and manage accordingly. Mammogram : N/a  Labs: vaginal swab Refills: depo  Referral: none  Routine preventative health maintenance measures emphasized. Please refer to After Visit Summary for other counseling recommendations.      Philip Aspen, CNM [facility]

## 2021-11-16 NOTE — Patient Instructions (Signed)

## 2021-11-16 NOTE — Progress Notes (Deleted)
Date last pap: Never done Last Depo-Provera: 08/24/2021 Side Effects if any: None. Serum HCG indicated? N/A. Depo-Provera 150 mg IM given by: Beverely Pace CMA. Next appointment due: nov24-dec 8

## 2021-11-19 LAB — CERVICOVAGINAL ANCILLARY ONLY
Bacterial Vaginitis (gardnerella): POSITIVE — AB
Candida Glabrata: NEGATIVE
Candida Vaginitis: POSITIVE — AB
Chlamydia: NEGATIVE
Comment: NEGATIVE
Comment: NEGATIVE
Comment: NEGATIVE
Comment: NEGATIVE
Comment: NEGATIVE
Comment: NORMAL
Neisseria Gonorrhea: NEGATIVE
Trichomonas: NEGATIVE

## 2021-11-20 ENCOUNTER — Encounter: Payer: Self-pay | Admitting: Certified Nurse Midwife

## 2021-11-20 ENCOUNTER — Other Ambulatory Visit: Payer: Self-pay | Admitting: Certified Nurse Midwife

## 2021-11-20 MED ORDER — METRONIDAZOLE 500 MG PO TABS: 500.0000 mg | ORAL_TABLET | Freq: Two times a day (BID) | ORAL | 0 refills | Status: AC

## 2021-11-20 MED ORDER — FLUCONAZOLE 150 MG PO TABS: 150.0000 mg | ORAL_TABLET | Freq: Every day | ORAL | 1 refills | Status: DC

## 2021-11-28 ENCOUNTER — Encounter: Payer: Self-pay | Admitting: Certified Nurse Midwife

## 2021-11-28 LAB — CYTOLOGY - PAP
Comment: NEGATIVE
Diagnosis: UNDETERMINED — AB
High risk HPV: NEGATIVE

## 2022-02-08 ENCOUNTER — Ambulatory Visit (INDEPENDENT_AMBULATORY_CARE_PROVIDER_SITE_OTHER): Payer: BC Managed Care – PPO

## 2022-02-08 VITALS — BP 116/79 | HR 74 | Resp 16 | Ht 65.0 in | Wt 198.8 lb

## 2022-02-08 DIAGNOSIS — Z3042 Encounter for surveillance of injectable contraceptive: Secondary | ICD-10-CM | POA: Diagnosis not present

## 2022-02-08 NOTE — Patient Instructions (Signed)

## 2022-02-08 NOTE — Progress Notes (Addendum)
    GYNECOLOGY PROGRESS NOTE  Subjective:    Patient ID: Rachel Garner, female    DOB: 2000-05-19, 21 y.o.   MRN: [id number]  HPI  Patient is a 21 y.o. G0P0000 female who presents for surveillance of depo provera injection.  Date last pap: 11/16/2021. Last Depo-Provera: 11/16/2021. Side Effects if any: None. Serum HCG indicated? N/A. Depo-Provera 150 mg IM given by:  Santiago Bumpers, CMA. Next appointment due Feb. 16 - March 2    The following portions of the patient's history were reviewed and updated as appropriate: allergies, current medications, past family history, past medical history, past social history, past surgical history, and problem list.  Review of Systems Pertinent items noted in HPI and remainder of comprehensive ROS otherwise negative.   Objective:   Blood pressure 116/79, pulse 74, resp. rate 16, height 5\' 5"  (1.651 m), weight 198 lb 12.8 oz (90.2 kg). Body mass index is 33.08 kg/m.  General appearance: alert, cooperative, and no distress  Assessment:   1. Encounter for surveillance of injectable contraceptive      Plan:   There are no diagnoses linked to this encounter.    , CMA [facility]

## 2022-02-21 NOTE — Progress Notes (Signed)
I have reviewed the nursing record and documentation and agrees .     Doreene Burke, CNM

## 2022-05-03 ENCOUNTER — Ambulatory Visit (INDEPENDENT_AMBULATORY_CARE_PROVIDER_SITE_OTHER): Payer: BC Managed Care – PPO

## 2022-05-03 VITALS — BP 122/71 | HR 58 | Ht 65.0 in | Wt 201.2 lb

## 2022-05-03 DIAGNOSIS — Z3042 Encounter for surveillance of injectable contraceptive: Secondary | ICD-10-CM | POA: Diagnosis not present

## 2022-05-03 MED ORDER — MEDROXYPROGESTERONE ACETATE 150 MG/ML IM SUSP
150.0000 mg | Freq: Once | INTRAMUSCULAR | Status: AC
  Administered 2022-05-03: 150 mg via INTRAMUSCULAR

## 2022-05-03 NOTE — Progress Notes (Signed)
    NURSE VISIT NOTE  Subjective:    Patient ID: Rachel Garner, female    DOB: 05/04/2000, 22 y.o.   MRN: [id number]  HPI  Patient is a 22 y.o. G0P0000 female who presents for depo provera injection.   Objective:    BP 122/71   Pulse (!) 58   Ht 5' 5"$  (1.651 m)   Wt 201 lb 3.2 oz (91.3 kg)   BMI 33.48 kg/m   Last Annual: 11/16/21. Last pap: 11/16/21. Last Depo-Provera: 02/08/22. Side Effects if any: n/a. Serum HCG indicated?  N/a . Depo-Provera 150 mg IM given by: Douglass Rivers, CMA. Site: Right Upper Outer Quandrant   Assessment:   1. Encounter for management and injection of depo-Provera      Plan:   Next appointment due between 07/20/22 and 08/03/22.    Marykay Lex, CMA

## 2022-07-26 ENCOUNTER — Ambulatory Visit (INDEPENDENT_AMBULATORY_CARE_PROVIDER_SITE_OTHER): Payer: BC Managed Care – PPO

## 2022-07-26 VITALS — BP 118/80 | HR 82 | Wt 193.0 lb

## 2022-07-26 DIAGNOSIS — Z3042 Encounter for surveillance of injectable contraceptive: Secondary | ICD-10-CM

## 2022-07-26 MED ORDER — MEDROXYPROGESTERONE ACETATE 150 MG/ML IM SUSP
150.0000 mg | Freq: Once | INTRAMUSCULAR | Status: AC
  Administered 2022-07-26: 150 mg via INTRAMUSCULAR

## 2022-07-26 NOTE — Patient Instructions (Signed)
Contraceptive Injection A contraceptive injection is a shot that prevents pregnancy. It is also called a birth control shot. The shot contains the hormone progestin, which prevents pregnancy by: Stopping the ovaries from releasing eggs. Thickening cervical mucus to prevent sperm from entering the cervix. Thinning the lining of the uterus to prevent a fertilized egg from attaching to the uterus. Contraceptive injections are given under the skin (subcutaneous) or into a muscle (intramuscular). For these shots to work, you must get one of them every 3 months (12-13 weeks) from a health care provider. Tell a health care provider about: Any allergies you have. All medicines you are taking, including vitamins, herbs, eye drops, creams, and over-the-counter medicines. Any blood disorders you have. Any medical conditions you have. Whether you are pregnant or may be pregnant. What are the risks? Generally, this is a safe procedure. However, problems may occur, including: Mood changes or depression. Loss of bone density (osteoporosis) after long-term use. Blood clots. This is rare. Higher risk of an egg being fertilized outside your uterus (ectopic pregnancy).This is rare. What happens before the procedure? Your health care provider may do a routine physical exam. You may have a test to make sure you are not pregnant. What happens during the procedure?  The area where the shot will be given will be cleaned and sanitized with alcohol. A needle will be inserted into a muscle in your upper arm or buttock, or into the skin of your thigh or abdomen. The needle will be attached to a syringe with the medicine inside of it. The medicine will be pushed through the syringe and injected into your body. A small bandage (dressing) may be placed over the injection site. What can I expect after the procedure? After the procedure, it is common to have: Soreness around the injection site for a couple of  days. Irregular menstrual bleeding. Weight gain. Breast tenderness. Headaches. Discomfort in your abdomen. Ask your health care provider whether you need to use an added method of birth control (backup contraception), such as a condom, sponge, or spermicide. If the first shot is given 1-7 days after the start of your last menstrual period, you will not need backup contraception. If the first shot is given at any other time during your menstrual cycle, you should avoid having sex. If you do have sex, you will need to use backup contraception for 7 days after you receive the shot. Follow these instructions at home: General instructions Take over-the-counter and prescription medicines only as told by your health care provider. Do not rub or massage the injection site. Track your menstrual periods so you will know if they become irregular. Always use a condom to protect against sexually transmitted infections (STIs). Make sure you schedule an appointment in time for your next shot and mark it on your calendar. You must get an injection every 3 months (12-13 weeks) to prevent pregnancy. Lifestyle Do not use any products that contain nicotine or tobacco. These products include cigarettes, chewing tobacco, and vaping devices, such as e-cigarettes. If you need help quitting, ask your health care provider. Eat foods that are high in calcium and vitamin D, such as milk, cheese, and salmon. Doing this may help with any loss in bone density caused by the contraceptive injection. Ask your health care provider for dietary recommendations. Contact a health care provider if you: Have nausea or vomiting. Have abnormal vaginal discharge or bleeding. Miss a menstrual period or think you might be pregnant. Experience mood changes   or depression. Feel dizzy or light-headed. Have leg pain. Get help right away if you: Have chest pain or cough up blood. Have shortness of breath. Have a severe headache that does  not go away. Have numbness in any part of your body. Have slurred speech or vision problems. Have vaginal bleeding that is abnormally heavy or does not stop, or you have severe pain in your abdomen. Have depression that does not get better with treatment. If you ever feel like you may hurt yourself or others, or have thoughts about taking your own life, get help right away. Go to your nearest emergency department or: Call your local emergency services (911 in the [place] Call a suicide crisis helpline, such as the National Suicide Prevention Lifeline at 1-800-273-8255 or 988 in the [place] This is open 24 hours a day in the [place] Text the Crisis Text Line at 741741 (in the [place] Summary A contraceptive injection is a shot that prevents pregnancy. It is also called the birth control shot. The shot is given under the skin (subcutaneous) or into a muscle (intramuscular). After this procedure, it is common to have soreness around the injection site for a couple of days. To prevent pregnancy, the shot must be given by a health care provider every 3 months (12-13 weeks). After you have the shot, ask your health care provider whether you need to use an added method of birth control (backup contraception), such as a condom, sponge, or spermicide. This information is not intended to replace advice given to you by your health care provider. Make sure you discuss any questions you have with your health care provider. Document Revised: 09/20/2020 Document Reviewed: 09/06/2019 Elsevier Patient Education  2023 Elsevier Inc.  

## 2022-07-26 NOTE — Progress Notes (Signed)
    NURSE VISIT NOTE  Subjective:    Patient ID: RAMIKA DERMAN, female    DOB: 25-Aug-2000, 22 y.o.   MRN: [id number]  HPI  Patient is a 22 y.o. G0P0000 female who presents for depo provera injection.   Objective:    BP 118/80   Pulse 82   Wt 193 lb (87.5 kg)   BMI 32.12 kg/m   Last Annual: 11/16/21. Last pap: 11/16/21. Last Depo-Provera: 05/03/22. Side Effects if any: none. Serum HCG indicated? No . Depo-Provera 150 mg IM given by: Cornelius Moras, CMA. Site: Right Ventrogluteal    Assessment:   1. Encounter for management and injection of depo-Provera      Plan:   Next appointment due between 10/11/22 and 10/25/22.    Cornelius Moras, CMA

## 2022-10-17 ENCOUNTER — Ambulatory Visit (INDEPENDENT_AMBULATORY_CARE_PROVIDER_SITE_OTHER): Payer: BC Managed Care – PPO

## 2022-10-17 VITALS — Ht 65.0 in | Wt 185.0 lb

## 2022-10-17 DIAGNOSIS — Z3042 Encounter for surveillance of injectable contraceptive: Secondary | ICD-10-CM | POA: Diagnosis not present

## 2022-10-17 MED ORDER — MEDROXYPROGESTERONE ACETATE 150 MG/ML IM SUSY
150.0000 mg | PREFILLED_SYRINGE | Freq: Once | INTRAMUSCULAR | Status: AC
Start: 2022-10-17 — End: 2022-10-17
  Administered 2022-10-17: 150 mg via INTRAMUSCULAR

## 2022-10-17 NOTE — Patient Instructions (Signed)

## 2022-10-17 NOTE — Progress Notes (Signed)
    NURSE VISIT NOTE  Subjective:    Patient ID: Rachel Garner, female    DOB: 02/22/2001, 22 y.o.   MRN: [id number]  HPI  Patient is a 22 y.o. G0P0000 female who presents for depo provera injection.   Objective:    Ht 5\' 5"  (1.651 m)   Wt 185 lb (83.9 kg)   BMI 30.79 kg/m   Last Annual: 11/16/21. Last pap: 11/16/21. Last Depo-Provera: 07/26/22. Side Effects if any: none. Serum HCG indicated? No . Depo-Provera 150 mg IM given by: Donnetta Hail, CMA. Site: Right Upper Outer Quandrant    Assessment:   1. Encounter for surveillance of injectable contraceptive      Plan:   Next appointment due between Oct 24 and Nov 7. Patient was advised to schedule annual at check out.    Donnetta Hail, CMA

## 2022-11-19 ENCOUNTER — Encounter: Payer: Self-pay | Admitting: Certified Nurse Midwife

## 2022-11-19 ENCOUNTER — Ambulatory Visit (INDEPENDENT_AMBULATORY_CARE_PROVIDER_SITE_OTHER): Payer: BC Managed Care – PPO | Admitting: Certified Nurse Midwife

## 2022-11-19 VITALS — BP 120/76 | HR 66 | Ht 65.0 in | Wt 186.6 lb

## 2022-11-19 DIAGNOSIS — Z01419 Encounter for gynecological examination (general) (routine) without abnormal findings: Secondary | ICD-10-CM

## 2022-11-19 DIAGNOSIS — R350 Frequency of micturition: Secondary | ICD-10-CM | POA: Diagnosis not present

## 2022-11-19 DIAGNOSIS — Z01411 Encounter for gynecological examination (general) (routine) with abnormal findings: Secondary | ICD-10-CM | POA: Diagnosis not present

## 2022-11-19 LAB — POCT URINALYSIS DIPSTICK
Bilirubin, UA: NEGATIVE
Blood, UA: NEGATIVE
Glucose, UA: NEGATIVE
Ketones, UA: NEGATIVE
Leukocytes, UA: NEGATIVE
Nitrite, UA: NEGATIVE
Protein, UA: NEGATIVE
Spec Grav, UA: <=1.005 — AB (ref 1.010–1.025)
Urobilinogen, UA: 0.2 U/dL
pH, UA: 7 (ref 5.0–8.0)

## 2022-11-19 MED ORDER — MEDROXYPROGESTERONE ACETATE 150 MG/ML IM SUSY: PREFILLED_SYRINGE | INTRAMUSCULAR | 3 refills | Status: AC

## 2022-11-19 NOTE — Patient Instructions (Signed)
Urinary Tract Infection, Adult A urinary tract infection (UTI) is an infection of any part of the urinary tract. The urinary tract includes: The kidneys. The ureters. The bladder. The urethra. These organs make, store, and get rid of pee (urine) in the body. What are the causes? This infection is caused by germs (bacteria) in your genital area. These germs grow and cause swelling (inflammation) of your urinary tract. What increases the risk? The following factors may make you more likely to develop this condition: Using a small, thin tube (catheter) to drain pee. Not being able to control when you pee or poop (incontinence). Being female. If you are female, these things can increase the risk: Using these methods to prevent pregnancy: A medicine that kills sperm (spermicide). A device that blocks sperm (diaphragm). Having low levels of a female hormone (estrogen). Being pregnant. You are more likely to develop this condition if: You have genes that add to your risk. You are sexually active. You take antibiotic medicines. You have trouble peeing because of: A prostate that is bigger than normal, if you are female. A blockage in the part of your body that drains pee from the bladder. A kidney stone. A nerve condition that affects your bladder. Not getting enough to drink. Not peeing often enough. You have other conditions, such as: Diabetes. A weak disease-fighting system (immune system). Sickle cell disease. Gout. Injury of the spine. What are the signs or symptoms? Symptoms of this condition include: Needing to pee right away. Peeing small amounts often. Pain or burning when peeing. Blood in the pee. Pee that smells bad or not like normal. Trouble peeing. Pee that is cloudy. Fluid coming from the vagina, if you are female. Pain in the belly or lower back. Other symptoms include: Vomiting. Not feeling hungry. Feeling mixed up (confused). This may be the first symptom in  older adults. Being tired and grouchy (irritable). A fever. Watery poop (diarrhea). How is this treated? Taking antibiotic medicine. Taking other medicines. Drinking enough water. In some cases, you may need to see a specialist. Follow these instructions at home:  Medicines Take over-the-counter and prescription medicines only as told by your doctor. If you were prescribed an antibiotic medicine, take it as told by your doctor. Do not stop taking it even if you start to feel better. General instructions Make sure you: Pee until your bladder is empty. Do not hold pee for a long time. Empty your bladder after sex. Wipe from front to back after peeing or pooping if you are a female. Use each tissue one time when you wipe. Drink enough fluid to keep your pee pale yellow. Keep all follow-up visits. Contact a doctor if: You do not get better after 1-2 days. Your symptoms go away and then come back. Get help right away if: You have very bad back pain. You have very bad pain in your lower belly. You have a fever. You have chills. You feeling like you will vomit or you vomit. Summary A urinary tract infection (UTI) is an infection of any part of the urinary tract. This condition is caused by germs in your genital area. There are many risk factors for a UTI. Treatment includes antibiotic medicines. Drink enough fluid to keep your pee pale yellow. This information is not intended to replace advice given to you by your health care provider. Make sure you discuss any questions you have with your health care provider. Document Revised: 10/03/2019 Document Reviewed: 10/08/2019 Elsevier Patient Education    2024 [employer] Inc.  

## 2022-11-19 NOTE — Progress Notes (Signed)
GYNECOLOGY ANNUAL PREVENTATIVE CARE ENCOUNTER NOTE  History:     Rachel Garner is a 22 y.o. G0P0000 female here for a routine annual gynecologic exam.  Current complaints: urinary frequency.   Denies abnormal vaginal bleeding, discharge, pelvic pain, problems with intercourse or other gynecologic concerns.     Social Relationship: female partner  Living: with partner and 2 cats Work: Buyer, retail  Exercise: 1 x wk  Smoke/Alcohol/drug use: MJ nightly   Gynecologic History No LMP recorded. Patient has had an injection. Contraception: Depo-Provera injections Last Pap: 11/16/2021. Results were: abnormal with negative HPV Last mammogram: n/a .    [facility] - 11/19/22 0916       Pregnancy Intention Screening   Does the patient want to become pregnant in the next year? No    Does the patient's partner want to become pregnant in the next year? No    Would the patient like to discuss contraceptive options today? No      Contraception Wrap Up   Current Method Hormonal Injection    End Method Hormonal Injection    Contraception Counseling Provided No            The pregnancy intention screening data noted above was reviewed. Potential methods of contraception were discussed. The patient elected to proceed with Hormonal Injection.   Obstetric History OB History  Gravida Para Term Preterm AB Living  0 0 0 0 0 0  SAB IAB Ectopic Multiple Live Births  0 0 0 0 0    Past Medical History:  Diagnosis Date   Allergy    seasonal (ragweed, "pigweed")   Anxiety    Depression     Past Surgical History:  Procedure Laterality Date   NO PAST SURGERIES      Current Outpatient Medications on File Prior to Visit  Medication Sig Dispense Refill   albuterol (VENTOLIN HFA) 108 (90 Base) MCG/ACT inhaler Inhale 2 puffs into the lungs every 4 (four) hours as needed for wheezing or shortness of breath. 1 Inhaler 0   buPROPion (WELLBUTRIN XL) 300 MG 24 hr  tablet Take by mouth.     medroxyPROGESTERone Acetate 150 MG/ML SUSY INJECT 1 ML (150 MG TOTAL) INTO THE MUSCLE EVERY 3 MONTHS 1 mL 3   buPROPion (WELLBUTRIN XL) 150 MG 24 hr tablet Take 1 tablet by mouth daily. (Patient not taking: Reported on 11/19/2022)     No current facility-administered medications on file prior to visit.    No Known Allergies  Social History:  reports that she has never smoked. She has never used smokeless tobacco. She reports current drug use. Frequency: 4.00 times per week. Drug: Marijuana. She reports that she does not drink alcohol.  Family History  Problem Relation Age of Onset   Alcohol abuse Mother    Depression Mother    Breast cancer Mother 36       HER1   Alcohol abuse Father    Heart disease Maternal Grandmother    Skin cancer Maternal Grandmother    Thyroid disease Maternal Grandmother    Colon cancer Maternal Grandmother    Multiple sclerosis Paternal Grandmother    Heart disease Paternal Grandfather    Stroke Neg Hx    Ovarian cancer Neg Hx     The following portions of the patient's history were reviewed and updated as appropriate: allergies, current medications, past family history, past medical history, past social history, past surgical history and problem  list.  Review of Systems Pertinent items noted in HPI and remainder of comprehensive ROS otherwise negative.  Physical Exam:  BP 120/76   Pulse 66   Ht 5\' 5"  (1.651 m)   Wt 186 lb 9.6 oz (84.6 kg)   BMI 31.05 kg/m  CONSTITUTIONAL: Well-developed, well-nourished female in no acute distress.  HENT:  Normocephalic, atraumatic, External right and left ear normal. Oropharynx is clear and moist EYES: Conjunctivae and EOM are normal. Pupils are equal, round, and reactive to light. No scleral icterus.  NECK: Normal range of motion, supple, no masses.  Normal thyroid.  SKIN: Skin is warm and dry. No rash noted. Not diaphoretic. No erythema. No pallor. MUSCULOSKELETAL: Normal range of  motion. No tenderness.  No cyanosis, clubbing, or edema.  2+ distal pulses. NEUROLOGIC: Alert and oriented to person, place, and time. Normal reflexes, muscle tone coordination.  PSYCHIATRIC: Normal mood and affect. Normal behavior. Normal judgment and thought content. CARDIOVASCULAR: Normal heart rate noted, regular rhythm RESPIRATORY: Clear to auscultation bilaterally. Effort and breath sounds normal, no problems with respiration noted. BREASTS: Symmetric in size. No masses, tenderness, skin changes, nipple drainage, or lymphadenopathy bilaterally.  ABDOMEN: Soft, no distention noted.  No tenderness, rebound or guarding.  PELVIC: Normal appearing external genitalia and urethral meatus; normal appearing vaginal mucosa and cervix.  No abnormal discharge noted.  Pap smear not due.  Normal uterine size, no other palpable masses, no uterine or adnexal tenderness.  .   Assessment and Plan:    Annual Gyn Exam   Pap: not due until 2026) Mammogram : n/a  Labs: urine culture  Refills: depo Referral: none  Routine preventative health maintenance measures emphasized. Please refer to After Visit Summary for other counseling recommendations.      Doreene Burke, CNM [facility]  [facility],  [facility]

## 2022-11-21 LAB — URINE CULTURE: Organism ID, Bacteria: NO GROWTH

## 2023-01-09 ENCOUNTER — Ambulatory Visit: Payer: BC Managed Care – PPO

## 2023-01-09 VITALS — BP 128/69 | HR 78 | Ht 65.0 in | Wt 184.0 lb

## 2023-01-09 DIAGNOSIS — Z3042 Encounter for surveillance of injectable contraceptive: Secondary | ICD-10-CM | POA: Diagnosis not present

## 2023-01-09 MED ORDER — MEDROXYPROGESTERONE ACETATE 150 MG/ML IM SUSY
150.0000 mg | PREFILLED_SYRINGE | Freq: Once | INTRAMUSCULAR | Status: AC
Start: 2023-01-09 — End: 2023-01-09
  Administered 2023-01-09: 150 mg via INTRAMUSCULAR

## 2023-01-09 NOTE — Progress Notes (Cosign Needed Addendum)
    NURSE VISIT NOTE  Subjective:    Patient ID: Rachel Garner, female    DOB: 2000/09/10, 22 y.o.   MRN: [id number]  HPI  Patient is a 22 y.o. G0P0000 female who presents for depo provera injection.   Objective:    BP 128/69   Pulse 78   Ht 5\' 5"  (1.651 m)   BMI 31.05 kg/m   Last Annual: 11/19/2022. Last pap: 11/16/21. Last Depo-Provera: 10/17/2022. Side Effects if any: none. Serum HCG indicated? No . Depo-Provera 150 mg IM given by: Beverely Pace, CMA. Site: Left Ventrogluteal   Assessment:   1. Surveillance for Depo-Provera contraception      Plan:   Next appointment due between JAN 16 and 30.    Loney Laurence, CMA

## 2023-01-29 ENCOUNTER — Encounter: Payer: Self-pay | Admitting: Emergency Medicine

## 2023-01-29 ENCOUNTER — Ambulatory Visit
Admission: EM | Admit: 2023-01-29 | Discharge: 2023-01-29 | Disposition: A | Discharge status: Home / Self Care | Payer: BC Managed Care – PPO | Attending: Emergency Medicine | Admitting: Emergency Medicine

## 2023-01-29 DIAGNOSIS — H66002 Acute suppurative otitis media without spontaneous rupture of ear drum, left ear: Secondary | ICD-10-CM | POA: Diagnosis not present

## 2023-01-29 DIAGNOSIS — J069 Acute upper respiratory infection, unspecified: Secondary | ICD-10-CM | POA: Diagnosis not present

## 2023-01-29 MED ORDER — AMOXICILLIN-POT CLAVULANATE 875-125 MG PO TABS: 1.0000 | ORAL_TABLET | Freq: Two times a day (BID) | ORAL | 0 refills | Status: AC

## 2023-01-29 MED ORDER — BENZONATATE 100 MG PO CAPS: 200.0000 mg | ORAL_CAPSULE | Freq: Three times a day (TID) | ORAL | 0 refills | Status: DC

## 2023-01-29 MED ORDER — IPRATROPIUM BROMIDE 0.06 % NA SOLN: 2.0000 | Freq: Four times a day (QID) | NASAL | 12 refills | Status: DC

## 2023-01-29 MED ORDER — PROMETHAZINE-DM 6.25-15 MG/5ML PO SYRP: 5.0000 mL | ORAL_SOLUTION | Freq: Four times a day (QID) | ORAL | 0 refills | Status: DC | PRN

## 2023-01-29 NOTE — ED Triage Notes (Signed)
Pt presents with a cough, sore throat and congestion x 2 weeks. She developed left ear pain 2 days ago. She has been taking OTC cold medication with no relief.

## 2023-01-29 NOTE — Discharge Instructions (Addendum)
Take the Augmentin twice daily for 10 days with food for treatment of your ear infection.  Take an over-the-counter probiotic 1 hour after each dose of antibiotic to prevent diarrhea.  Use over-the-counter Tylenol and ibuprofen as needed for pain or fever.  Place a hot water bottle, or heating pad, underneath your pillowcase at night to help dilate up your ear and aid in pain relief as well as resolution of the infection.  Use the Atrovent nasal spray, 2 squirts in each nostril every 6 hours, as needed for runny nose and postnasal drip.  Use the Tessalon Perles every 8 hours during the day.  Take them with a small sip of water.  They may give you some numbness to the base of your tongue or a metallic taste in your mouth, this is normal.  Use the Promethazine DM cough syrup at bedtime for cough and congestion.  It will make you drowsy so do not take it during the day.  Return for reevaluation or see your primary care provider for any new or worsening symptoms.   

## 2023-01-29 NOTE — ED Provider Notes (Signed)
[facility]    CSN: [id number] Arrival date & time: 01/29/23  1812      History   Chief Complaint Chief Complaint  Patient presents with   Nasal Congestion   Sore Throat   Otalgia    HPI Rachel Garner is a 22 y.o. female.   HPI  22 year old female with a past medical history significant for anxiety, depression, seasonal allergies, and psychogenic nausea and vomiting presents for evaluation of 2 weeks worth of respiratory symptoms.  She endorses nasal congestion with yellow nasal discharge, sore throat, productive cough for yellow sputum x 2 weeks with the development of left ear pain in the last 2 days.  No fever, shortness of breath, wheezing, or GI complaints.  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 Authorizing Provider  amoxicillin-clavulanate (AUGMENTIN) 875-125 MG tablet Take 1 tablet by mouth every 12 (twelve) hours for 10 days. 01/29/23 02/08/23 Yes Becky Augusta, NP  benzonatate (TESSALON) 100 MG capsule Take 2 capsules (200 mg total) by mouth every 8 (eight) hours. 01/29/23  Yes Becky Augusta, NP  ipratropium (ATROVENT) 0.06 % nasal spray Place 2 sprays into both nostrils 4 (four) times daily. 01/29/23  Yes Becky Augusta, NP  promethazine-dextromethorphan (PROMETHAZINE-DM) 6.25-15 MG/5ML syrup Take 5 mLs by mouth 4 (four) times daily  as needed. 01/29/23  Yes Becky Augusta, NP  albuterol (VENTOLIN HFA) 108 (90 Base) MCG/ACT inhaler Inhale 2 puffs into the lungs every 4 (four) hours as needed for wheezing or shortness of breath. 07/05/18   Don Perking, [place], MD  buPROPion (WELLBUTRIN XL) 300 MG 24 hr tablet Take by mouth. 10/17/22   [provider]  medroxyPROGESTERone Acetate 150 MG/ML SUSY INJECT 1 ML (150 MG TOTAL) INTO THE MUSCLE EVERY 3 MONTHS 11/19/22   Doreene Burke, CNM    Family History Family History  Problem Relation Age of Onset   Alcohol abuse Mother    Depression Mother    Breast cancer Mother 52       HER1   Alcohol abuse Father    Heart disease Maternal Grandmother    Skin cancer Maternal Grandmother    Thyroid disease Maternal Grandmother    Colon cancer Maternal Grandmother    Multiple sclerosis Paternal Grandmother    Heart disease Paternal Grandfather    Stroke Neg Hx    Ovarian cancer Neg Hx     Social History Social History   Tobacco Use   Smoking status: Never   Smokeless tobacco: Never  Vaping Use   Vaping status: Never Used  Substance  Use Topics   Alcohol use: No   Drug use: Yes    Frequency: 4.0 times per week    Types: Marijuana     Allergies   Patient has no known allergies.   Review of Systems Review of Systems  Constitutional:  Negative for fever.  HENT:  Positive for congestion, ear pain, rhinorrhea and sore throat.   Respiratory:  Positive for cough. Negative for shortness of breath and wheezing.   Gastrointestinal:  Negative for diarrhea, nausea and vomiting.     Physical Exam Triage Vital Signs ED Triage Vitals  Encounter Vitals Group     BP      Systolic BP Percentile      Diastolic BP Percentile      Pulse      Resp      Temp      Temp src      SpO2      Weight      Height      Head Circumference      Peak Flow      Pain Score      Pain Loc      Pain Education      Exclude from Growth Chart    No data found.  Updated Vital  Signs BP 116/76   Pulse 71   Temp 98.3 F (36.8 C) (Oral)   Resp 16   SpO2 100%   Visual Acuity Right Eye Distance:   Left Eye Distance:   Bilateral Distance:    Right Eye Near:   Left Eye Near:    Bilateral Near:     Physical Exam Vitals and nursing note reviewed.  Constitutional:      Appearance: Normal appearance. She is not ill-appearing.  HENT:     Head: Normocephalic and atraumatic.     Right Ear: Tympanic membrane, ear canal and external ear normal. There is no impacted cerumen.     Left Ear: Ear canal and external ear normal. There is no impacted cerumen.     Ears:     Comments: Right tympanic membrane is erythematous.  Both EACs are clear.    Nose: Congestion and rhinorrhea present.     Comments: Patient mucosa is erythematous and edematous with yellow discharge in both nares.    Mouth/Throat:     Mouth: Mucous membranes are moist.     Pharynx: Oropharynx is clear. Posterior oropharyngeal erythema present. No oropharyngeal exudate.     Comments: Erythema and injection to the posterior oropharynx. Cardiovascular:     Rate and Rhythm: Normal rate and regular rhythm.     Pulses: Normal pulses.     Heart sounds: Normal heart sounds. No murmur heard.    No friction rub. No gallop.  Pulmonary:     Effort: Pulmonary effort is normal.     Breath sounds: Normal breath sounds. No wheezing, rhonchi or rales.  Musculoskeletal:     Cervical back: Normal range of motion and neck supple. No tenderness.  Skin:    General: Skin is warm and dry.     Capillary Refill: Capillary refill takes less than 2 seconds.     Findings: No rash.  Neurological:     General: No focal deficit present.     Mental Status: She is alert and oriented to person, place, and time.      UC Treatments / Results  Labs (all labs ordered are listed, but only abnormal results are displayed) Labs Reviewed - No data to  display  EKG   Radiology No results found.  Procedures Procedures  (including critical care time)  Medications Ordered in UC Medications - No data to display  Initial Impression / Assessment and Plan / UC Course  I have reviewed the triage vital signs and the nursing notes.  Pertinent labs & imaging results that were available during my care of the patient were reviewed by me and considered in my medical decision making (see chart for details).   Patient is a very pleasant, nontoxic-appearing 22 year old female presenting for evaluation of 2 weeks worth of respiratory symptoms as outlined HPI above.  Her physical exam does reveal the presence of an upper respiratory infection as well as a developing otitis media on the left.  I will discharge her home with a diagnosis of URI with cough and congestion and left otitis media and started on Augmentin 875 twice daily for 10 days.  Also Atrovent nasal spray to help with nasal congestion along with Tessalon Perles for cough for the day and Promethazine DM cough syrup for cough at bedtime.  Tylenol and/or ibuprofen as needed for fever or pain.  Return precautions reviewed.   Final Clinical Impressions(s) / UC Diagnoses   Final diagnoses:  URI with cough and congestion  Non-recurrent acute suppurative otitis media of left ear without spontaneous rupture of tympanic membrane     Discharge Instructions      Take the Augmentin twice daily for 10 days with food for treatment of your ear infection.  Take an over-the-counter probiotic 1 hour after each dose of antibiotic to prevent diarrhea.  Use over-the-counter Tylenol and ibuprofen as needed for pain or fever.  Place a hot water bottle, or heating pad, underneath your pillowcase at night to help dilate up your ear and aid in pain relief as well as resolution of the infection.  Use the Atrovent nasal spray, 2 squirts in each nostril every 6 hours, as needed for runny nose and postnasal drip.  Use the Tessalon Perles every 8 hours during the day.  Take them with  a small sip of water.  They may give you some numbness to the base of your tongue or a metallic taste in your mouth, this is normal.  Use the Promethazine DM cough syrup at bedtime for cough and congestion.  It will make you drowsy so do not take it during the day.  Return for reevaluation or see your primary care provider for any new or worsening symptoms.      ED Prescriptions     Medication Sig Dispense Auth. Provider   amoxicillin-clavulanate (AUGMENTIN) 875-125 MG tablet Take 1 tablet by mouth every 12 (twelve) hours for 10 days. 20 tablet Becky Augusta, NP   benzonatate (TESSALON) 100 MG capsule Take 2 capsules (200 mg total) by mouth every 8 (eight) hours. 21 capsule Becky Augusta, NP   ipratropium (ATROVENT) 0.06 % nasal spray Place 2 sprays into both nostrils 4 (four) times daily. 15 mL Becky Augusta, NP   promethazine-dextromethorphan (PROMETHAZINE-DM) 6.25-15 MG/5ML syrup Take 5 mLs by mouth 4 (four) times daily as needed. 118 mL Becky Augusta, NP      PDMP not reviewed this encounter.   Becky Augusta, NP 01/29/23 [phone]

## 2023-03-28 ENCOUNTER — Ambulatory Visit (INDEPENDENT_AMBULATORY_CARE_PROVIDER_SITE_OTHER): Payer: BC Managed Care – PPO

## 2023-03-28 VITALS — BP 128/79 | HR 73 | Ht 65.0 in | Wt 182.0 lb

## 2023-03-28 DIAGNOSIS — Z3042 Encounter for surveillance of injectable contraceptive: Secondary | ICD-10-CM

## 2023-03-28 MED ORDER — MEDROXYPROGESTERONE ACETATE 150 MG/ML IM SUSY
150.0000 mg | PREFILLED_SYRINGE | Freq: Once | INTRAMUSCULAR | Status: AC
  Administered 2023-03-28: 150 mg via INTRAMUSCULAR

## 2023-03-28 NOTE — Progress Notes (Signed)
    NURSE VISIT NOTE  Subjective:    Patient ID: Rachel Garner, female    DOB: 22-Dec-2000, 23 y.o.   MRN: [id number]  HPI  Patient is a 23 y.o. G0P0000 female who presents for depo provera injection.   Objective:    BP 128/79   Pulse 73   Ht 5\' 5"  (1.651 m)   Wt 182 lb (82.6 kg)   BMI 30.29 kg/m   Last Annual: 11/19/22. Last pap: 11/16/21. Last Depo-Provera: 01/09/23. Side Effects if any: none. Serum HCG indicated? No . Depo-Provera 150 mg IM given by: Donnetta Hail, CMA. Site: Right Upper Outer Quandrant   Assessment:   1. Encounter for surveillance of injectable contraceptive      Plan:   Next appointment due between April 4 and April 18.    Donnetta Hail, CMA

## 2023-03-28 NOTE — Patient Instructions (Signed)

## 2023-04-13 ENCOUNTER — Encounter: Payer: Self-pay | Admitting: Emergency Medicine

## 2023-04-13 ENCOUNTER — Ambulatory Visit (INDEPENDENT_AMBULATORY_CARE_PROVIDER_SITE_OTHER): Payer: Worker's Compensation

## 2023-04-13 ENCOUNTER — Ambulatory Visit
Admission: EM | Admit: 2023-04-13 | Discharge: 2023-04-13 | Disposition: A | Discharge status: Home / Self Care | Payer: BC Managed Care – PPO | Attending: Emergency Medicine | Admitting: Emergency Medicine

## 2023-04-13 DIAGNOSIS — M79671 Pain in right foot: Secondary | ICD-10-CM

## 2023-04-13 MED ORDER — ACETAMINOPHEN-CODEINE 300-30 MG PO TABS
1.0000 | ORAL_TABLET | Freq: Four times a day (QID) | ORAL | 0 refills | Status: AC | PRN
Start: 2023-04-13 — End: ?

## 2023-04-13 MED ORDER — PREDNISONE 10 MG (21) PO TBPK: ORAL_TABLET | Freq: Every day | ORAL | 0 refills | Status: DC

## 2023-04-13 NOTE — ED Triage Notes (Signed)
Pt ran over her right foot with a foot jack last night at work.

## 2023-04-13 NOTE — Discharge Instructions (Signed)
X-ray is negative  Your foot has been wrapped in compression to help with pain with movement, as this as stability support, may use as needed  May use prednisone every morning with food as directed to reduce internal inflammation and pain, may take Tylenol or Tylenol No. 3 every 6 hours additionally, Tylenol 3 has codeine in it which can make you drowsy, be mindful of this  You may use ice or heat over the affected area 10 to 15-minute intervals  You may elevate whenever sitting and lying to help reduce swelling  If no improvement seen in your symptoms by 2 weeks post injury please follow-up with one of the specialist listed on front page

## 2023-04-13 NOTE — ED Provider Notes (Signed)
[facility]    CSN: [id number] Arrival date & time: 04/13/23  1308      History   Chief Complaint Chief Complaint  Patient presents with   Foot Injury    HPI Rachel Garner is a 23 y.o. female.   Patient presents for evaluation of pain, swelling and bruising present to the right foot beginning 1 day ago after she rolled over her foot with a pallet at work.  Painful to bear weight and complete all range of motion.  Initial tingling has resolved.  Has attempted use of Tylenol which has been ineffective.  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 Authorizing Provider  acetaminophen-codeine (TYLENOL #3) 300-30 MG tablet Take 1 tablet by mouth every 6 (six) hours as needed for moderate pain (pain score 4-6). 04/13/23  Yes Jakavion Bilodeau R, NP  buPROPion (WELLBUTRIN XL) 300 MG 24 hr tablet Take by mouth. 10/17/22  Yes [provider]  medroxyPROGESTERone Acetate 150 MG/ML SUSY INJECT 1 ML (150 MG TOTAL) INTO THE MUSCLE EVERY 3 MONTHS 11/19/22  Yes Doreene Burke, CNM  predniSONE (STERAPRED UNI-PAK 21 TAB) 10 MG (21) TBPK tablet Take by mouth daily. Take 6 tabs by mouth daily  for 1 days, then 5 tabs for 1 days, then 4 tabs for 1 days, then 3 tabs for 1 days, 2 tabs for 1 days, then 1 tab by mouth daily for 1 days 04/13/23  Yes Sabre Romberger, Elita Boone, NP   albuterol (VENTOLIN HFA) 108 (90 Base) MCG/ACT inhaler Inhale 2 puffs into the lungs every 4 (four) hours as needed for wheezing or shortness of breath. 07/05/18   Nita Sickle, MD    Family History Family History  Problem Relation Age of Onset   Alcohol abuse Mother    Depression Mother    Breast cancer Mother 23       HER1   Alcohol abuse Father    Heart disease Maternal Grandmother    Skin cancer Maternal Grandmother    Thyroid disease Maternal Grandmother    Colon cancer Maternal Grandmother    Multiple sclerosis Paternal Grandmother    Heart disease Paternal Grandfather    Stroke Neg Hx    Ovarian cancer Neg Hx     Social History Social History   Tobacco Use   Smoking status: Never   Smokeless tobacco: Never  Vaping Use   Vaping status: Never Used  Substance Use Topics   Alcohol use: No   Drug use: Yes    Frequency: 4.0 times per week  Types: Marijuana     Allergies   Patient has no known allergies.   Review of Systems Review of Systems   Physical Exam Triage Vital Signs ED Triage Vitals  Encounter Vitals Group     BP 04/13/23 0839 116/65     Systolic BP Percentile --      Diastolic BP Percentile --      Pulse Rate 04/13/23 0839 89     Resp 04/13/23 0839 18     Temp 04/13/23 0839 99 F (37.2 C)     Temp Source 04/13/23 0839 Oral     SpO2 04/13/23 0839 97 %     Weight --      Height --      Head Circumference --      Peak Flow --      Pain Score 04/13/23 0838 8     Pain Loc --      Pain Education --      Exclude from Growth Chart --    No data found.  Updated Vital Signs BP 116/65 (BP Location: Left Arm)   Pulse 89   Temp 99 F (37.2 C) (Oral)   Resp 18   SpO2 97%   Visual Acuity Right Eye Distance:   Left Eye Distance:   Bilateral Distance:    Right Eye Near:   Left Eye Near:    Bilateral Near:     Physical Exam Constitutional:      Appearance: Normal appearance.  Eyes:     Extraocular Movements: Extraocular  movements intact.  Pulmonary:     Effort: Pulmonary effort is normal.  Musculoskeletal:     Comments: Tenderness, moderate swelling and ecchymosis noted to the anterior of the right midfoot, painful to bear weight, sensation intact, 2+ pedal pulse  Neurological:     Mental Status: She is alert and oriented to person, place, and time. Mental status is at baseline.      UC Treatments / Results  Labs (all labs ordered are listed, but only abnormal results are displayed) Labs Reviewed - No data to display  EKG   Radiology DG Foot Complete Right Result Date: 04/13/2023 CLINICAL DATA:  23 year old female with history of right foot pain. EXAM: RIGHT FOOT COMPLETE - 3+ VIEW COMPARISON:  No priors. FINDINGS: There is no evidence of fracture or dislocation. There is no evidence of arthropathy or other focal bone abnormality. Soft tissues are unremarkable. IMPRESSION: Negative. Electronically Signed   By: Trudie Reed M.D.   On: 04/13/2023 09:02    Procedures Procedures (including critical care time)  Medications Ordered in UC Medications - No data to display  Initial Impression / Assessment and Plan / UC Course  I have reviewed the triage vital signs and the nursing notes.  Pertinent labs & imaging results that were available during my care of the patient were reviewed by me and considered in my medical decision making (see chart for details).  Right foot pain  X-ray negative, discussed findings, compression wrap applied for stability and support, to be used as needed, prescribed prednisone and Tylenol 3, PDMP reviewed, low risk, may continue activity as tolerated, recommended RICE with follow-up with specialist if needed Final Clinical Impressions(s) / UC Diagnoses   Final diagnoses:  Right foot pain     Discharge Instructions      X-ray is negative  Your foot has been wrapped in compression to help with pain with movement, as this as stability support, may use as  needed  May use prednisone every morning with food as directed to reduce internal inflammation and pain, may take Tylenol or Tylenol No. 3 every 6 hours additionally, Tylenol 3 has codeine in it which can make you drowsy, be mindful of this  You may use ice or heat over the affected area 10 to 15-minute intervals  You may elevate whenever sitting and lying to help reduce swelling  If no improvement seen in your symptoms by 2 weeks post injury please follow-up with one of the specialist listed on front page   ED Prescriptions     Medication Sig Dispense Auth. Provider   predniSONE (STERAPRED UNI-PAK 21 TAB) 10 MG (21) TBPK tablet Take by mouth daily. Take 6 tabs by mouth daily  for 1 days, then 5 tabs for 1 days, then 4 tabs for 1 days, then 3 tabs for 1 days, 2 tabs for 1 days, then 1 tab by mouth daily for 1 days 21 tablet Dawnell Bryant R, NP   acetaminophen-codeine (TYLENOL #3) 300-30 MG tablet Take 1 tablet by mouth every 6 (six) hours as needed for moderate pain (pain score 4-6). 12 tablet Gerardo Caiazzo, Elita Boone, NP      I have reviewed the PDMP during this encounter.   Valinda Hoar, [place] 04/13/23 [phone]

## 2023-06-19 NOTE — Progress Notes (Unsigned)
    NURSE VISIT NOTE  Subjective:    Patient ID: Rachel Garner, female    DOB: 2000-11-12, 23 y.o.   MRN: [id number]  HPI  Patient is a 23 y.o. G0P0000 female who presents for depo provera injection.   Objective:    There were no vitals taken for this visit.  Last Annual:11/16/21. Last pap: 11/16/21. Last Depo-Provera: 01/09/23. Side Effects if any: none. Serum HCG indicated? No . Depo-Provera 150 mg IM given by: Cornelius Moras, CMA. Site: Right Upper Outer Quandrant  Lab Review  @THIS  VISIT ONLY@  Assessment:   No diagnosis found.   Plan:   Next appointment due between June 27th and July 25th,2025.    Cornelius Moras, CMA

## 2023-06-20 ENCOUNTER — Ambulatory Visit (INDEPENDENT_AMBULATORY_CARE_PROVIDER_SITE_OTHER): Payer: BC Managed Care – PPO

## 2023-06-20 VITALS — BP 100/62 | HR 85 | Ht 65.0 in | Wt 196.0 lb

## 2023-06-20 DIAGNOSIS — Z3042 Encounter for surveillance of injectable contraceptive: Secondary | ICD-10-CM | POA: Diagnosis not present

## 2023-06-20 MED ORDER — MEDROXYPROGESTERONE ACETATE 150 MG/ML IM SUSP
150.0000 mg | Freq: Once | INTRAMUSCULAR | Status: AC
  Administered 2023-06-20: 150 mg via INTRAMUSCULAR

## 2023-09-09 ENCOUNTER — Ambulatory Visit (INDEPENDENT_AMBULATORY_CARE_PROVIDER_SITE_OTHER)

## 2023-09-09 VITALS — BP 114/80 | HR 85 | Ht 65.0 in | Wt 185.1 lb

## 2023-09-09 DIAGNOSIS — Z3042 Encounter for surveillance of injectable contraceptive: Secondary | ICD-10-CM | POA: Diagnosis not present

## 2023-09-09 MED ORDER — MEDROXYPROGESTERONE ACETATE 150 MG/ML IM SUSP
150.0000 mg | Freq: Once | INTRAMUSCULAR | Status: AC
  Administered 2023-09-09: 150 mg via INTRAMUSCULAR

## 2023-09-09 NOTE — Progress Notes (Signed)
    NURSE VISIT NOTE  Subjective:    Patient ID: Rachel Garner, female    DOB: 2000-04-07, 23 y.o.   MRN: [id number]  HPI  Patient is a 23 y.o. G0P0000 female who presents for depo provera  injection.   Objective:    BP 114/80   Pulse 85   Ht 5' 5 (1.651 m)   Wt 185 lb 1.6 oz (84 kg)   BMI 30.80 kg/m   Last Annual: 11/19/2022. Last pap: 11/16/2021. Last Depo-Provera : 06/20/2023. Side Effects if any: none. Serum HCG indicated? No . Depo-Provera  150 mg IM given by: Rollo Louder, RN. Site: Left Ventrogluteal  Lab Review  No results found for any visits on 09/09/23.  Assessment:   1. Encounter for surveillance of injectable contraceptive      Plan:   Next depo due between 11/25/2023 and 12/09/2023.  Patient will be due for her annual exam.  She will plan on scheduling her annual during this window so she can get Depo at same appointment.    Rollo FORBES Louder, RN

## 2023-12-08 ENCOUNTER — Ambulatory Visit (INDEPENDENT_AMBULATORY_CARE_PROVIDER_SITE_OTHER)

## 2023-12-08 ENCOUNTER — Ambulatory Visit: Admitting: Certified Nurse Midwife

## 2023-12-08 VITALS — BP 116/67 | HR 69 | Ht 65.0 in | Wt 189.0 lb

## 2023-12-08 DIAGNOSIS — Z3042 Encounter for surveillance of injectable contraceptive: Secondary | ICD-10-CM

## 2023-12-08 MED ORDER — MEDROXYPROGESTERONE ACETATE 150 MG/ML IM SUSP
150.0000 mg | Freq: Once | INTRAMUSCULAR | Status: AC
  Administered 2023-12-08: 150 mg via INTRAMUSCULAR

## 2023-12-08 NOTE — Patient Instructions (Signed)

## 2023-12-08 NOTE — Progress Notes (Signed)
    NURSE VISIT NOTE  Subjective:    Patient ID: Rachel Garner, female    DOB: 2000-12-04, 23 y.o.   MRN: [id number]  HPI  Patient is a 23 y.o. G0P0000 female who presents for depo provera  injection.   Objective:    BP 116/67   Pulse 69   Ht 5' 5 (1.651 m)   Wt 189 lb (85.7 kg)   LMP  (Within Months)   BMI 31.45 kg/m   Last Annual: 11/19/22. Last pap: 11/16/2021. Last Depo-Provera : 09/09/2023. Side Effects if any: none. Serum HCG indicated? No . Depo-Provera  150 mg IM given by: [name], CMA. Site: Right Ventrogluteal  Lab Review  No results found for any visits on 12/08/23.  Assessment:   1. Surveillance for Depo-Provera  contraception      Plan:   Next appointment due between 12/15 and 03/08/24.    [name], CMA

## 2023-12-10 ENCOUNTER — Ambulatory Visit: Admitting: Certified Nurse Midwife

## 2023-12-10 NOTE — Progress Notes (Unsigned)
 GYNECOLOGY ANNUAL PHYSICAL EXAM PROGRESS NOTE  Subjective:    Rachel Garner is a 23 y.o. G0P0000 female who presents for an annual exam.  The patient {is/is not/has never been:13135} sexually active. The patient participates in regular exercise: {yes/no/not asked:9010}. Has the patient ever been transfused or tattooed?: {yes/no/not asked:9010}. The patient reports that there {is/is not:9024} domestic violence in her life.   The patient has the following complaints today:   Menstrual History: Menarche age: *** No LMP recorded. Patient has had an injection.     Gynecologic History:  Contraception: Depo-Provera  injections History of STI's: Denies Last Pap: 11/16/2021. Results were: abnormal.  Notes h/o abnormal pap smears. Last mammogram: Not age appropriate       OB History  Gravida Para Term Preterm AB Living  0 0 0 0 0 0  SAB IAB Ectopic Multiple Live Births  0 0 0 0 0    Past Medical History:  Diagnosis Date   Allergy    seasonal (ragweed, pigweed)   Anxiety    Depression     Past Surgical History:  Procedure Laterality Date   NO PAST SURGERIES      Family History  Problem Relation Age of Onset   Alcohol abuse Mother    Depression Mother    Breast cancer Mother 61       HER1   Alcohol abuse Father    Heart disease Maternal Grandmother    Skin cancer Maternal Grandmother    Thyroid disease Maternal Grandmother    Colon cancer Maternal Grandmother    Multiple sclerosis Paternal Grandmother    Heart disease Paternal Grandfather    Stroke Neg Hx    Ovarian cancer Neg Hx     Social History   Socioeconomic History   Marital status: Single    Spouse name: Not on file   Number of children: Not on file   Years of education: Not on file   Highest education level: Not on file  Occupational History   Not on file  Tobacco Use   Smoking status: Never   Smokeless tobacco: Never  Vaping Use   Vaping status: Never Used  Substance and Sexual  Activity   Alcohol use: No   Drug use: Yes    Frequency: 4.0 times per week    Types: Marijuana   Sexual activity: Yes    Birth control/protection: Injection  Other Topics Concern   Not on file  Social History Narrative   Not on file   Social Drivers of Health   Financial Resource Strain: Low Risk  (10/20/2023)   Received from [facility]   Overall Financial Resource Strain (CARDIA)    Difficulty of Paying Living Expenses: Not hard at all  Food Insecurity: No Food Insecurity (10/20/2023)   Received from [facility]   Hunger Vital Sign    Within the past 12 months, you worried that your food would run out before you got the money to buy more.: Never true    Within the past 12 months, the food you bought just didn't last and you didn't have money to get more.: Never true  Transportation Needs: No Transportation Needs (10/20/2023)   Received from [facility] - Transportation    In the past 12 months, has lack of transportation kept you from medical appointments or from getting medications?: No    Lack of Transportation (Non-Medical): No  Physical Activity: Not on file  Stress: Not on file  Social Connections: Not on file  Intimate Partner Violence: Not on file    Current Outpatient Medications on File Prior to Visit  Medication Sig Dispense Refill   acetaminophen -codeine  (TYLENOL  #3) 300-30 MG tablet Take 1 tablet by mouth every 6 (six) hours as needed for moderate pain (pain score 4-6). (Patient not taking: Reported on 12/08/2023) 12 tablet 0   albuterol  (VENTOLIN  HFA) 108 (90 Base) MCG/ACT inhaler Inhale 2 puffs into the lungs every 4 (four) hours as needed for wheezing or shortness of breath. 1 Inhaler 0   buPROPion (WELLBUTRIN XL) 300 MG 24 hr tablet Take by mouth.     medroxyPROGESTERone  Acetate 150 MG/ML SUSY INJECT 1 ML (150 MG TOTAL) INTO THE MUSCLE EVERY 3 MONTHS 1 mL 3   predniSONE  (STERAPRED UNI-PAK 21 TAB) 10  MG (21) TBPK tablet Take by mouth daily. Take 6 tabs by mouth daily  for 1 days, then 5 tabs for 1 days, then 4 tabs for 1 days, then 3 tabs for 1 days, 2 tabs for 1 days, then 1 tab by mouth daily for 1 days (Patient not taking: Reported on 09/09/2023) 21 tablet 0   No current facility-administered medications on file prior to visit.    No Known Allergies   Review of Systems Constitutional: negative for chills, fatigue, fevers and sweats Eyes: negative for irritation, redness and visual disturbance Ears, nose, mouth, throat, and face: negative for hearing loss, nasal congestion, snoring and tinnitus Respiratory: negative for asthma, cough, sputum Cardiovascular: negative for chest pain, dyspnea, exertional chest pressure/discomfort, irregular heart beat, palpitations and syncope Gastrointestinal: negative for abdominal pain, change in bowel habits, nausea and vomiting Genitourinary: negative for abnormal menstrual periods, genital lesions, sexual problems and vaginal discharge, dysuria and urinary incontinence Integument/breast: negative for breast lump, breast tenderness and nipple discharge Hematologic/lymphatic: negative for bleeding and easy bruising Musculoskeletal:negative for back pain and muscle weakness Neurological: negative for dizziness, headaches, vertigo and weakness Endocrine: negative for diabetic symptoms including polydipsia, polyuria and skin dryness Allergic/Immunologic: negative for hay fever and urticaria      Objective:  There were no vitals taken for this visit. There is no height or weight on file to calculate BMI.    General Appearance:    Alert, cooperative, no distress, appears stated age  Head:    Normocephalic, without obvious abnormality, atraumatic  Eyes:    PERRL, conjunctiva/corneas clear, EOM's intact, both eyes  Ears:    Normal external ear canals, both ears  Nose:   Nares normal, septum midline, mucosa normal, no drainage or sinus tenderness   Throat:   Lips, mucosa, and tongue normal; teeth and gums normal  Neck:   Supple, symmetrical, trachea midline, no adenopathy; thyroid: no enlargement/tenderness/nodules; no carotid bruit or JVD  Back:     Symmetric, no curvature, ROM normal, no CVA tenderness  Lungs:     Clear to auscultation bilaterally, respirations unlabored  Chest Wall:    No tenderness or deformity   Heart:    Regular rate and rhythm, S1 and S2 normal, no murmur, rub or gallop  Breast Exam:    No tenderness, masses, or nipple abnormality  Abdomen:     Soft, non-tender, bowel sounds active all four quadrants, no masses, no organomegaly.    Genitalia:    Pelvic:external genitalia normal, vagina without lesions, discharge, or tenderness, rectovaginal septum  normal. Cervix normal in appearance, no cervical motion tenderness, no adnexal masses or tenderness.  Uterus normal size, shape,  mobile, regular contours, nontender.  Rectal:    Normal external sphincter.  No hemorrhoids appreciated. Internal exam not done.   Extremities:   Extremities normal, atraumatic, no cyanosis or edema  Pulses:   2+ and symmetric all extremities  Skin:   Skin color, texture, turgor normal, no rashes or lesions  Lymph nodes:   Cervical, supraclavicular, and axillary nodes normal  Neurologic:   CNII-XII intact, normal strength, sensation and reflexes throughout   .  Labs:  Lab Results  Component Value Date   WBC 6.4 10/10/2020   HGB 12.4 10/10/2020   HCT 37.5 10/10/2020   MCV 86.8 10/10/2020   PLT 359 10/10/2020    Lab Results  Component Value Date   CREATININE 0.55 10/10/2020   BUN <5 (L) 10/10/2020   NA 139 10/10/2020   K 3.7 10/10/2020   CL 111 10/10/2020   CO2 20 (L) 10/10/2020    Lab Results  Component Value Date   ALT 14 10/10/2020   AST 17 10/10/2020   ALKPHOS 86 10/10/2020   BILITOT 0.6 10/10/2020    No results found for: TSH   Assessment:   No diagnosis found.   Plan:  Blood tests: {blood  tests:13147}. Breast self exam technique reviewed and patient encouraged to perform self-exam monthly. Contraception: Depo-Provera  injections. Discussed healthy lifestyle modifications. Mammogram Not age appropriate Pap smear UTD. Flu vaccine: Follow up in 1 year for annual exam   Zelda Hummer, CNM [facility]

## 2023-12-10 NOTE — Patient Instructions (Incomplete)
 Preventive Care 8-24 Years Old, Female  Preventive care refers to lifestyle choices and visits with your health care provider that can promote health and wellness. Preventive care visits are also called wellness exams. What can I expect for my preventive care visit? Counseling During your preventive care visit, your health care provider may ask about your: Medical history, including: Past medical problems. Family medical history. Pregnancy history. Current health, including: Menstrual cycle. Method of birth control. Emotional well-being. Home life and relationship well-being. Sexual activity and sexual health. Lifestyle, including: Alcohol, nicotine or tobacco, and drug use. Access to firearms. Diet, exercise, and sleep habits. Work and work Astronomer. Sunscreen use. Safety issues such as seatbelt and bike helmet use. Physical exam Your health care provider may check your: Height and weight. These may be used to calculate your BMI (body mass index). BMI is a measurement that tells if you are at a healthy weight. Waist circumference. This measures the distance around your waistline. This measurement also tells if you are at a healthy weight and may help predict your risk of certain diseases, such as type 2 diabetes and high blood pressure. Heart rate and blood pressure. Body temperature. Skin for abnormal spots. What immunizations do I need?  Vaccines are usually given at various ages, according to a schedule. Your health care provider will recommend vaccines for you based on your age, medical history, and lifestyle or other factors, such as travel or where you work. What tests do I need? Screening Your health care provider may recommend screening tests for certain conditions. This may include: Pelvic exam and Pap test. Lipid and cholesterol levels. Diabetes screening. This is done by checking your blood sugar (glucose) after you have not eaten for a while  (fasting). Hepatitis B test. Hepatitis C test. HIV (human immunodeficiency virus) test. STI (sexually transmitted infection) testing, if you are at risk. BRCA-related cancer screening. This may be done if you have a family history of breast, ovarian, tubal, or peritoneal cancers. Talk with your health care provider about your test results, treatment options, and if necessary, the need for more tests. Follow these instructions at home: Eating and drinking  Eat a healthy diet that includes fresh fruits and vegetables, whole grains, lean protein, and low-fat dairy products. Take vitamin and mineral supplements as recommended by your health care provider. Do not drink alcohol if: Your health care provider tells you not to drink. You are pregnant, may be pregnant, or are planning to become pregnant. If you drink alcohol: Limit how much you have to 0-1 drink a day. Know how much alcohol is in your drink. In the [place] one drink equals one 12 oz bottle of beer (355 mL), one 5 oz glass of wine (148 mL), or one 1 oz glass of hard liquor (44 mL). Lifestyle Brush your teeth every morning and night with fluoride toothpaste. Floss one time each day. Exercise for at least 30 minutes 5 or more days each week. Do not use any products that contain nicotine or tobacco. These products include cigarettes, chewing tobacco, and vaping devices, such as e-cigarettes. If you need help quitting, ask your health care provider. Do not use drugs. If you are sexually active, practice safe sex. Use a condom or other form of protection to prevent STIs. If you do not wish to become pregnant, use a form of birth control. If you plan to become pregnant, see your health care provider for a prepregnancy visit. Find healthy ways to manage stress, such as:  Meditation, yoga, or listening to music. Journaling. Talking to a trusted person. Spending time with friends and family. Minimize exposure to UV radiation to reduce your  risk of skin cancer. Safety Always wear your seat belt while driving or riding in a vehicle. Do not drive: If you have been drinking alcohol. Do not ride with someone who has been drinking. If you have been using any mind-altering substances or drugs. While texting. When you are tired or distracted. Wear a helmet and other protective equipment during sports activities. If you have firearms in your house, make sure you follow all gun safety procedures. Seek help if you have been physically or sexually abused. What's next? Go to your health care provider once a year for an annual wellness visit. Ask your health care provider how often you should have your eyes and teeth checked. Stay up to date on all vaccines. This information is not intended to replace advice given to you by your health care provider. Make sure you discuss any questions you have with your health care provider. Document Revised: 08/23/2020 Document Reviewed: 08/23/2020 Elsevier Patient Education  2024 [employer]  How to Do a Breast Self-Exam Doing breast self-exams can help you stay healthy. They're one way to know what's normal for your breasts. They can help you catch a problem while it's still small and can be treated. You need to: Check your breasts often. Tell your doctor about any changes. You should do breast self-exams even if you have breast implants. What you need: A mirror. A well-lit room. A pillow or other soft object. How to do a breast self-exam Look for changes  Take off all the clothes above your waist. Stand in front of a mirror in a room with good lighting. Put your hands down at your sides. Compare your breasts in the mirror. Look for difference between them, such as: Differences in shape. Differences in size. Wrinkles, dips, and bumps in one breast and not the other. Look at each breast for skin changes, such as: Redness. Scaly spots. Spots where your skin is thicker. Dimpling. Open  sores. Look for changes in your nipples, such as: Fluid coming out of a nipple. Fluid around a nipple. Bleeding. Dimpling. Redness. A nipple that looks pushed in or that has changed position. Feel for changes Lie on your back. Feel each breast. To do this: Pick a breast to feel. Place a pillow under the shoulder closest to that breast. Put the arm closest to that breast behind your head. Feel the breast using the hand of your other arm. Use the pads of your three middle fingers to make small circles starting near the nipple. Use light, medium, and firm pressure. Keep making circles, moving down over the breast. Stop when you feel your ribs. Start making circles with your fingers again, this time going up until you reach your collarbone. Then, make circles out across your breast and into your armpit area. Squeeze your nipple. Check for fluid and lumps. Do these steps again to check your other breast. Sit or stand in the tub or shower. With soapy water on your skin, feel each breast the same way you did when you were lying down. Write down what you find Writing down what you find can help you keep track of what you want to tell your doctor. Write down: What's normal for each breast. Any changes you find. Write down: The kind of change. If your breast feels tender or painful. Any lump you  find. Write down its size and where it is. When you last had your period. General tips If you're breastfeeding, the best time to check your breasts is after you feed your baby or after you use a breast pump. If you get a period, the best time to check your breasts is 5-7 days after your period ends. With time, you'll get more used to doing the self-exam. You'll also start to know if there are changes in your breasts. Contact a doctor if: You see a change in the shape or size of your breasts or nipples. You see a change in the skin of your breast or nipples. You have fluid coming from your nipples  that isn't normal. You find a new lump or thick area. You have breast pain. You have any concerns about your breast health. This information is not intended to replace advice given to you by your health care provider. Make sure you discuss any questions you have with your health care provider. Document Revised: 05/07/2023 Document Reviewed: 05/07/2023 Elsevier Patient Education  2025 [employer].

## 2023-12-12 ENCOUNTER — Ambulatory Visit (INDEPENDENT_AMBULATORY_CARE_PROVIDER_SITE_OTHER): Admitting: Certified Nurse Midwife

## 2023-12-12 ENCOUNTER — Encounter: Payer: Self-pay | Admitting: Certified Nurse Midwife

## 2023-12-12 VITALS — BP 113/73 | HR 67 | Resp 16 | Ht 65.0 in | Wt 187.7 lb

## 2023-12-12 DIAGNOSIS — Z23 Encounter for immunization: Secondary | ICD-10-CM | POA: Diagnosis not present

## 2023-12-12 DIAGNOSIS — Z01419 Encounter for gynecological examination (general) (routine) without abnormal findings: Secondary | ICD-10-CM

## 2024-02-26 ENCOUNTER — Ambulatory Visit

## 2024-02-27 ENCOUNTER — Ambulatory Visit

## 2024-03-01 ENCOUNTER — Ambulatory Visit (INDEPENDENT_AMBULATORY_CARE_PROVIDER_SITE_OTHER)

## 2024-03-01 VITALS — BP 91/76 | HR 88 | Ht 65.0 in | Wt 205.0 lb

## 2024-03-01 DIAGNOSIS — Z3042 Encounter for surveillance of injectable contraceptive: Secondary | ICD-10-CM | POA: Diagnosis not present

## 2024-03-01 MED ORDER — MEDROXYPROGESTERONE ACETATE 150 MG/ML IM SUSP
150.0000 mg | Freq: Once | INTRAMUSCULAR | Status: AC
  Administered 2024-03-01: 150 mg via INTRAMUSCULAR

## 2024-03-01 NOTE — Patient Instructions (Signed)

## 2024-03-01 NOTE — Progress Notes (Addendum)
" ° ° °  NURSE VISIT NOTE  Subjective:    Patient ID: Rachel Garner, female    DOB: 04-13-2000, 23 y.o.   MRN: [id number]  HPI  Patient is a 23 y.o. G0P0000 female who presents for depo provera  injection.   Objective:    BP 91/76   Pulse 88   Ht 5' 5 (1.651 m)   Wt 205 lb (93 kg)   LMP  (LMP Unknown)   BMI 34.11 kg/m   Last Annual: 12/12/23. Last pap: 11/16/21. Last Depo-Provera : 12/08/23. Side Effects if any: none. Serum HCG indicated? No . Depo-Provera  150 mg IM given by: Mathis Getting, CMA. Site: Left Upper Outer Quandrant  Lab Review  No results found for any visits on 03/01/24.  Assessment:   1. Encounter for surveillance of injectable contraceptive      Plan:   Next appointment due between 05/17/24 and 05/31/24.    Mathis LITTIE Getting, CMA  "

## 2024-03-09 ENCOUNTER — Encounter: Payer: Self-pay | Admitting: Certified Nurse Midwife

## 2024-05-24 ENCOUNTER — Ambulatory Visit
